# Patient Record
Sex: Female | Born: 1950 | Race: White | Hispanic: No | Smoking: Former smoker
Health system: Southern US, Community
[De-identification: ages and names within clinical notes are randomized; demographics above are authoritative.]

## PROBLEM LIST (undated history)

## (undated) DIAGNOSIS — E7800 Pure hypercholesterolemia, unspecified: Secondary | ICD-10-CM

## (undated) DIAGNOSIS — C801 Malignant (primary) neoplasm, unspecified: Secondary | ICD-10-CM

## (undated) DIAGNOSIS — I10 Essential (primary) hypertension: Secondary | ICD-10-CM

## (undated) HISTORY — PX: LUMBAR DISC SURGERY: SHX700

---

## 2010-09-05 ENCOUNTER — Ambulatory Visit: Payer: Self-pay | Admitting: Internal Medicine

## 2010-10-20 ENCOUNTER — Ambulatory Visit: Payer: Self-pay | Admitting: Orthopedic Surgery

## 2010-12-09 ENCOUNTER — Ambulatory Visit: Payer: Self-pay | Admitting: Pain Medicine

## 2011-01-14 ENCOUNTER — Ambulatory Visit: Payer: Self-pay | Admitting: Pain Medicine

## 2011-03-19 ENCOUNTER — Ambulatory Visit: Payer: Self-pay | Admitting: Sports Medicine

## 2011-04-07 ENCOUNTER — Ambulatory Visit: Payer: Self-pay | Admitting: Pain Medicine

## 2011-10-21 ENCOUNTER — Ambulatory Visit: Payer: Self-pay | Admitting: Internal Medicine

## 2012-07-28 ENCOUNTER — Ambulatory Visit: Payer: Self-pay | Admitting: Medical

## 2012-08-03 HISTORY — PX: CARPAL TUNNEL RELEASE: SHX101

## 2012-12-30 ENCOUNTER — Ambulatory Visit: Payer: Self-pay | Admitting: Internal Medicine

## 2013-05-03 HISTORY — PX: COLONOSCOPY: SHX174

## 2013-05-23 ENCOUNTER — Ambulatory Visit: Payer: Self-pay | Admitting: Gastroenterology

## 2013-05-23 LAB — HM COLONOSCOPY

## 2013-05-24 LAB — PATHOLOGY REPORT

## 2013-07-13 ENCOUNTER — Ambulatory Visit: Payer: Self-pay | Admitting: Orthopedic Surgery

## 2013-07-17 LAB — PATHOLOGY REPORT

## 2013-08-03 HISTORY — PX: MELANOMA EXCISION: SHX5266

## 2014-01-17 LAB — LIPID PANEL
Cholesterol: 150 mg/dL (ref 0–200)
HDL: 33 mg/dL — AB (ref 35–70)
LDL Cholesterol: 68 mg/dL
Triglycerides: 245 mg/dL — AB (ref 40–160)

## 2014-01-17 LAB — CBC AND DIFFERENTIAL: Hemoglobin: 15.5 g/dL (ref 12.0–16.0)

## 2014-01-17 LAB — TSH: TSH: 1.07 u[IU]/mL (ref <=5.90)

## 2014-01-24 ENCOUNTER — Ambulatory Visit: Payer: Self-pay | Admitting: Internal Medicine

## 2014-02-23 LAB — HM MAMMOGRAPHY: HM Mammogram: NORMAL

## 2014-08-20 ENCOUNTER — Ambulatory Visit: Payer: Self-pay | Admitting: Physician Assistant

## 2014-10-05 ENCOUNTER — Ambulatory Visit: Payer: Self-pay | Admitting: Internal Medicine

## 2014-11-23 NOTE — Op Note (Signed)
PATIENT NAME:  Casey Lara, Casey Lara MR#:  [id number] DATE OF BIRTH:  November 13, 1950  DATE OF PROCEDURE:  07/13/2013  PREOPERATIVE DIAGNOSIS: Right volar ganglion cyst, left dorsal ganglion cyst and carpal tunnel syndrome.   POSTOPERATIVE DIAGNOSIS: Right volar ganglion cyst, left dorsal ganglion cyst and carpal tunnel syndrome.   PROCEDURE: Removal, right volar ganglion cyst, removal, left dorsal ganglion cyst and left carpal tunnel release.   ANESTHESIA: General.   SURGEON: Hessie Knows, M.D.   DESCRIPTION OF PROCEDURE: The patient was brought to the operating room and after adequate anesthesia was obtained, both arms were prepped and draped in the usual sterile fashion. After patient identification and timeout procedures were completed, attention was turned to the right arm. The tourniquet was raised to 250 mmHg. A curvilinear incision was made to expose the ganglion cyst. It was quite extensive. The cyst was then excised and the base exposed down to the radiocarpal joint, which was debrided and cauterized to prevent recurrence. The tourniquet was let down, and there was a small branch of the radial artery which had excessive bleeding. This was cauterized. There is still a strong radial artery pulse at the close of the case and after good hemostasis had been achieved, the wound was thoroughly irrigated and with a tourniquet down, the wound was closed with simple interrupted 4-0 nylon skin sutures. The wound was dressed with Xeroform and left alone as the right arm was addressed. Tourniquet time on the right was 9 minutes. Going to the left arm, thee tourniquet was raised. The carpal tunnel was released first with a volar approach at the ring metacarpal. The skin and subcutaneous tissue were incised, after raising the tourniquet. The transcarpal ligament identified and incised with a hemostat underneath to protect the underlying structures. Release was carried out distally until fat was noted around the nerve.  Going proximally, there appeared to be constriction in the proximal half of the canal. After release,  the nerve appeared to have good vascular blush. There was extensive flexor tenosynovitis present. No masses. The wound was irrigated and then closed with first local infiltration of 10 mL 0.5% Sensorcaine without epinephrine. Wound was closed with 4-0 Vicryl. Going to the dorsum of the wrist, a transverse incision was made directly over the ganglion. It was approximately 2 cm in diameter. After opening the skin and removing the surrounding tissue, the ganglion popped up. It was amputated at its base and then tracked down to the dorsal radiocarpal joint, which was then also cauterized and abraded to try to cause scar tissue and prevent recurrence. The dorsal wound was then closed with 5-0 nylon skin suture. Xeroform, 4 x 4's, Webril and a volar splint were then applied. The tourniquet was then let down and going back to the right side, 4 x 4's, Webril and a volar splint were applied along with an Ace wrap. Tourniquet time on the left was 15 minutes at 250 mmHg on the right 9 minutes at 250 mmHg.   SPECIMEN: Ganglion cyst from each wrist.   COMPLICATIONS:  None.   EBL:  Minimal.    ____________________________ Laurene Footman, MD mjm:cc D: 07/13/2013 23:14:54 ET T: 07/14/2013 00:30:36 ET JOB#: [id number]  cc: Laurene Footman, MD, <Dictator> Laurene Footman MD ELECTRONICALLY SIGNED 07/14/2013 8:26

## 2015-02-11 ENCOUNTER — Other Ambulatory Visit: Payer: Self-pay | Admitting: Internal Medicine

## 2015-02-11 ENCOUNTER — Encounter: Payer: Self-pay | Admitting: Internal Medicine

## 2015-02-11 DIAGNOSIS — F419 Anxiety disorder, unspecified: Secondary | ICD-10-CM | POA: Insufficient documentation

## 2015-02-11 DIAGNOSIS — G4733 Obstructive sleep apnea (adult) (pediatric): Secondary | ICD-10-CM | POA: Insufficient documentation

## 2015-02-11 DIAGNOSIS — T7840XA Allergy, unspecified, initial encounter: Secondary | ICD-10-CM | POA: Insufficient documentation

## 2015-02-11 DIAGNOSIS — Z86010 Personal history of colonic polyps: Secondary | ICD-10-CM | POA: Insufficient documentation

## 2015-02-11 DIAGNOSIS — E785 Hyperlipidemia, unspecified: Secondary | ICD-10-CM | POA: Insufficient documentation

## 2015-02-11 DIAGNOSIS — Z85820 Personal history of malignant melanoma of skin: Secondary | ICD-10-CM | POA: Insufficient documentation

## 2015-02-11 DIAGNOSIS — I10 Essential (primary) hypertension: Secondary | ICD-10-CM | POA: Insufficient documentation

## 2015-02-11 DIAGNOSIS — F411 Generalized anxiety disorder: Secondary | ICD-10-CM | POA: Insufficient documentation

## 2015-02-11 DIAGNOSIS — G4700 Insomnia, unspecified: Secondary | ICD-10-CM | POA: Insufficient documentation

## 2015-02-11 DIAGNOSIS — M189 Osteoarthritis of first carpometacarpal joint, unspecified: Secondary | ICD-10-CM | POA: Insufficient documentation

## 2015-03-27 ENCOUNTER — Ambulatory Visit
Admission: EM | Admit: 2015-03-27 | Discharge: 2015-03-27 | Disposition: A | Discharge status: Home / Self Care | Payer: Managed Care, Other (non HMO) | Attending: Family Medicine | Admitting: Family Medicine

## 2015-03-27 ENCOUNTER — Encounter: Payer: Self-pay | Admitting: Internal Medicine

## 2015-03-27 DIAGNOSIS — H109 Unspecified conjunctivitis: Secondary | ICD-10-CM | POA: Diagnosis not present

## 2015-03-27 DIAGNOSIS — J0100 Acute maxillary sinusitis, unspecified: Secondary | ICD-10-CM

## 2015-03-27 HISTORY — DX: Essential (primary) hypertension: I10

## 2015-03-27 HISTORY — DX: Pure hypercholesterolemia, unspecified: E78.00

## 2015-03-27 MED ORDER — AMOXICILLIN 875 MG PO TABS: 875.0000 mg | ORAL_TABLET | Freq: Two times a day (BID) | ORAL | Status: DC

## 2015-03-27 NOTE — ED Notes (Signed)
Started last Friday with bilateral eye crusting in the morning. "Teared all day". Had worn "old mascara" on Thursday night. Eyes "feel funny". States feels like there is a sore in nose. Today right ear pain.

## 2015-03-27 NOTE — ED Provider Notes (Signed)
CSN: [id number]     Arrival date & time 03/27/15  1338 History   First MD Initiated Contact with Patient 03/27/15 1420     Chief Complaint  Patient presents with  . Eye Problem   (Consider location/radiation/quality/duration/timing/severity/associated sxs/prior Treatment) HPI Comments: 64 yo female with 1 week h/o sinus pressure, nasal congestion/drainage, headaches, ear fullness. Also states eyes have been watering and occasionally crusty. Denies fevers, chills.  Patient is a 64 y.o. female presenting with eye problem.  Eye Problem   Past Medical History  Diagnosis Date  . Hypertension   . Hypercholesteremia    Past Surgical History  Procedure Laterality Date  . Melanoma excision  2015  . Carpal tunnel release Left 2014  . Lumbar disc surgery    . Colonoscopy  05/2013    serrated adenoma   Family History  Problem Relation Age of Onset  . Breast cancer Mother   . Stroke Mother   . Cancer Father    Social History  Substance Use Topics  . Smoking status: Current Every Day Smoker -- 1.00 packs/day    Types: Cigarettes  . Smokeless tobacco: None  . Alcohol Use: No   OB History    No data available     Review of Systems  Allergies  Review of patient's allergies indicates no known allergies.  Home Medications   Prior to Admission medications   Medication Sig Start Date End Date Taking? Authorizing Provider  albuterol (VENTOLIN HFA) 108 (90 BASE) MCG/ACT inhaler Inhale 2 puffs into the lungs 4 (four) times daily as needed. 10/05/14  Yes Historical Provider, MD  amLODipine (NORVASC) 5 MG tablet Take 1 tablet by mouth daily. 09/18/14  Yes Historical Provider, MD  atorvastatin (LIPITOR) 20 MG tablet Take 1 tablet by mouth daily. 11/12/14  Yes Historical Provider, MD  bisoprolol-hydrochlorothiazide (ZIAC) 10-6.25 MG per tablet Take 1 tablet by mouth daily. 09/18/14  Yes Historical Provider, MD  diazepam (VALIUM) 5 MG tablet Take 1 tablet by mouth 2 (two) times daily. 12/11/14   Yes Historical Provider, MD  fluticasone (FLONASE) 50 MCG/ACT nasal spray Place 2 sprays into the nose daily. 04/06/14  Yes Historical Provider, MD  losartan (COZAAR) 50 MG tablet Take 1 tablet by mouth daily. 10/30/14  Yes Historical Provider, MD  amoxicillin (AMOXIL) 875 MG tablet Take 1 tablet (875 mg total) by mouth 2 (two) times daily. 03/27/15   Norval Gable, MD   BP 132/69 mmHg  Pulse 66  Temp(Src) 98.5 F (36.9 C) (Oral)  Resp 17  Ht 5\' 2"  (1.575 m)  Wt 148 lb (67.132 kg)  BMI 27.06 kg/m2  SpO2 99% Physical Exam  Constitutional: She appears well-developed and well-nourished. No distress.  HENT:  Head: Normocephalic and atraumatic.  Right Ear: Tympanic membrane, external ear and ear canal normal.  Left Ear: Tympanic membrane, external ear and ear canal normal.  Nose: Mucosal edema and rhinorrhea present. No nose lacerations, sinus tenderness, nasal deformity, septal deviation or nasal septal hematoma. No epistaxis.  No foreign bodies. Right sinus exhibits maxillary sinus tenderness and frontal sinus tenderness. Left sinus exhibits maxillary sinus tenderness and frontal sinus tenderness.  Mouth/Throat: Uvula is midline, oropharynx is clear and moist and mucous membranes are normal. No oropharyngeal exudate.  Eyes: EOM are normal. Pupils are equal, round, and reactive to light. Right eye exhibits no discharge. Left eye exhibits no discharge. Right conjunctiva is injected. Left conjunctiva is injected. No scleral icterus.  No drainage  Neck: Normal range of motion. Neck  supple. No thyromegaly present.  Cardiovascular: Normal rate, regular rhythm and normal heart sounds.   Pulmonary/Chest: Effort normal and breath sounds normal. No respiratory distress. She has no wheezes. She has no rales.  Lymphadenopathy:    She has no cervical adenopathy.  Skin: She is not diaphoretic.  Nursing note and vitals reviewed.   ED Course  Procedures (including critical care time) Labs Review Labs  Reviewed - No data to display  Imaging Review No results found.   MDM   1. Acute maxillary sinusitis, recurrence not specified   2. Bilateral conjunctivitis   (likely viral conjunctivitis)  New Prescriptions   AMOXICILLIN (AMOXIL) 875 MG TABLET    Take 1 tablet (875 mg total) by mouth 2 (two) times daily.  Plan: 1. diagnosis reviewed with patient 2. rx as per orders; risks, benefits, potential side effects reviewed with patient 3. Recommend supportive treatment with otc lubricant eye drops and cool compresses prn  4. F/u prn if symptoms worsen or don't improve     Norval Gable, MD 03/27/15 1504

## 2015-04-12 ENCOUNTER — Ambulatory Visit: Payer: Self-pay | Admitting: Internal Medicine

## 2015-04-12 ENCOUNTER — Encounter: Payer: Self-pay | Admitting: Internal Medicine

## 2015-04-12 DIAGNOSIS — Z8619 Personal history of other infectious and parasitic diseases: Secondary | ICD-10-CM | POA: Insufficient documentation

## 2015-05-29 ENCOUNTER — Other Ambulatory Visit: Payer: Self-pay | Admitting: Internal Medicine

## 2015-05-29 DIAGNOSIS — Z1231 Encounter for screening mammogram for malignant neoplasm of breast: Secondary | ICD-10-CM

## 2015-06-06 ENCOUNTER — Ambulatory Visit
Admission: RE | Admit: 2015-06-06 | Discharge: 2015-06-06 | Disposition: A | Discharge status: Home / Self Care | Payer: Managed Care, Other (non HMO) | Source: Ambulatory Visit | Attending: Internal Medicine | Admitting: Internal Medicine

## 2015-06-06 DIAGNOSIS — Z1231 Encounter for screening mammogram for malignant neoplasm of breast: Secondary | ICD-10-CM | POA: Diagnosis present

## 2015-06-06 HISTORY — DX: Malignant (primary) neoplasm, unspecified: C80.1

## 2015-06-19 ENCOUNTER — Ambulatory Visit (INDEPENDENT_AMBULATORY_CARE_PROVIDER_SITE_OTHER): Payer: Managed Care, Other (non HMO) | Admitting: Internal Medicine

## 2015-06-19 ENCOUNTER — Encounter: Payer: Self-pay | Admitting: Internal Medicine

## 2015-06-19 VITALS — BP 110/64 | HR 72 | Ht 63.0 in | Wt 141.6 lb

## 2015-06-19 DIAGNOSIS — K5900 Constipation, unspecified: Secondary | ICD-10-CM | POA: Diagnosis not present

## 2015-06-19 DIAGNOSIS — M5442 Lumbago with sciatica, left side: Secondary | ICD-10-CM

## 2015-06-19 DIAGNOSIS — Z1159 Encounter for screening for other viral diseases: Secondary | ICD-10-CM | POA: Diagnosis not present

## 2015-06-19 DIAGNOSIS — Z0000 Encounter for general adult medical examination without abnormal findings: Secondary | ICD-10-CM | POA: Diagnosis not present

## 2015-06-19 DIAGNOSIS — I10 Essential (primary) hypertension: Secondary | ICD-10-CM | POA: Diagnosis not present

## 2015-06-19 DIAGNOSIS — M5136 Other intervertebral disc degeneration, lumbar region: Secondary | ICD-10-CM | POA: Insufficient documentation

## 2015-06-19 DIAGNOSIS — Z23 Encounter for immunization: Secondary | ICD-10-CM

## 2015-06-19 DIAGNOSIS — Z114 Encounter for screening for human immunodeficiency virus [HIV]: Secondary | ICD-10-CM | POA: Diagnosis not present

## 2015-06-19 DIAGNOSIS — D126 Benign neoplasm of colon, unspecified: Secondary | ICD-10-CM | POA: Diagnosis not present

## 2015-06-19 DIAGNOSIS — E785 Hyperlipidemia, unspecified: Secondary | ICD-10-CM

## 2015-06-19 LAB — POCT URINALYSIS DIPSTICK
Bilirubin, UA: NEGATIVE
Blood, UA: NEGATIVE
GLUCOSE UA: NEGATIVE
Ketones, UA: NEGATIVE
Leukocytes, UA: NEGATIVE
NITRITE UA: NEGATIVE
PROTEIN UA: NEGATIVE
Spec Grav, UA: 1.01
UROBILINOGEN UA: 0.2
pH, UA: 5

## 2015-06-19 MED ORDER — LUBIPROSTONE 24 MCG PO CAPS: 24.0000 ug | ORAL_CAPSULE | Freq: Two times a day (BID) | ORAL | Status: DC

## 2015-06-19 NOTE — Patient Instructions (Signed)
Breast Self-Awareness Practicing breast self-awareness may pick up problems early, prevent significant medical complications, and possibly save your life. By practicing breast self-awareness, you can become familiar with how your breasts look and feel and if your breasts are changing. This allows you to notice changes early. It can also offer you some reassurance that your breast health is good. One way to learn what is normal for your breasts and whether your breasts are changing is to do a breast self-exam. If you find a lump or something that was not present in the past, it is best to contact your caregiver right away. Other findings that should be evaluated by your caregiver include nipple discharge, especially if it is bloody; skin changes or reddening; areas where the skin seems to be pulled in (retracted); or new lumps and bumps. Breast pain is seldom associated with cancer (malignancy), but should also be evaluated by a caregiver. HOW TO PERFORM A BREAST SELF-EXAM The best time to examine your breasts is 5-7 days after your menstrual period is over. During menstruation, the breasts are lumpier, and it may be more difficult to pick up changes. If you do not menstruate, have reached menopause, or had your uterus removed (hysterectomy), you should examine your breasts at regular intervals, such as monthly. If you are breastfeeding, examine your breasts after a feeding or after using a breast pump. Breast implants do not decrease the risk for lumps or tumors, so continue to perform breast self-exams as recommended. Talk to your caregiver about how to determine the difference between the implant and breast tissue. Also, talk about the amount of pressure you should use during the exam. Over time, you will become more familiar with the variations of your breasts and more comfortable with the exam. A breast self-exam requires you to remove all your clothes above the waist. 1. Look at your breasts and nipples.  Stand in front of a mirror in a room with good lighting. With your hands on your hips, push your hands firmly downward. Look for a difference in shape, contour, and size from one breast to the other (asymmetry). Asymmetry includes puckers, dips, or bumps. Also, look for skin changes, such as reddened or scaly areas on the breasts. Look for nipple changes, such as discharge, dimpling, repositioning, or redness. 2. Carefully feel your breasts. This is best done either in the shower or tub while using soapy water or when flat on your back. Place the arm (on the side of the breast you are examining) above your head. Use the pads (not the fingertips) of your three middle fingers on your opposite hand to feel your breasts. Start in the underarm area and use  inch (2 cm) overlapping circles to feel your breast. Use 3 different levels of pressure (light, medium, and firm pressure) at each circle before moving to the next circle. The light pressure is needed to feel the tissue closest to the skin. The medium pressure will help to feel breast tissue a little deeper, while the firm pressure is needed to feel the tissue close to the ribs. Continue the overlapping circles, moving downward over the breast until you feel your ribs below your breast. Then, move one finger-width towards the center of the body. Continue to use the  inch (2 cm) overlapping circles to feel your breast as you move slowly up toward the collar bone (clavicle) near the base of the neck. Continue the up and down exam using all 3 pressures until you reach the   middle of the chest. Do this with each breast, carefully feeling for lumps or changes. 3.  Keep a written record with breast changes or normal findings for each breast. By writing this information down, you do not need to depend only on memory for size, tenderness, or location. Write down where you are in your menstrual cycle, if you are still menstruating. Breast tissue can have some lumps or  thick tissue. However, see your caregiver if you find anything that concerns you.  SEEK MEDICAL CARE IF:  You see a change in shape, contour, or size of your breasts or nipples.   You see skin changes, such as reddened or scaly areas on the breasts or nipples.   You have an unusual discharge from your nipples.   You feel a new lump or unusually thick areas.    This information is not intended to replace advice given to you by your health care provider. Make sure you discuss any questions you have with your health care provider.   Document Released: 07/20/2005 Document Revised: 07/06/2012 Document Reviewed: 11/04/2011 Elsevier Interactive Patient Education 2016 [employer]  

## 2015-06-19 NOTE — Progress Notes (Signed)
Date:  06/19/2015   Name:  Casey Lara   DOB:  Aug 20, 1950   MRN:  [id number]   Chief Complaint: Annual Exam; Hypertension; and Hyperlipidemia Casey Lara is a 64 y.o. female who presents today for her Complete Annual Exam. She feels well but tired. She reports exercising regularly with a Physiological scientist. She reports she is sleeping fairly well using valium.  Lumbar back pain - patient has degenerative disc disease in the lumbar spine. She has intermittent left thigh tingling. She denies any weakness or permanent neurologic symptoms. She continues to do her usual activities. She takes Tylenol for pain. She's been told that she should have back surgery and we discussed this. Hypertension This is a chronic problem. The current episode started more than 1 year ago. The problem is unchanged. The problem is controlled. Pertinent negatives include no chest pain, headaches, palpitations or shortness of breath. There are no associated agents to hypertension.  Hyperlipidemia This is a chronic problem. The problem is controlled. Recent lipid tests were reviewed and are normal. She has no history of diabetes or hypothyroidism. Pertinent negatives include no chest pain or shortness of breath. Current antihyperlipidemic treatment includes statins. The current treatment provides significant improvement of lipids.  Constipation This is a chronic problem. The current episode started more than 1 year ago. The problem is unchanged. Associated symptoms include back pain (chronic DDD). Pertinent negatives include no abdominal pain, diarrhea, difficulty urinating, fever, rectal pain, vomiting or weight loss. Risk factors include stress. She has tried laxatives and stool softeners for the symptoms. The treatment provided mild relief.   She has been taking MiraLAX daily and supplementing with milk of magnesia if needed. However she either has small hard stools or explosive diarrhea. Her colonoscopy 2 years  ago was essentially normal. She has been unable to get on a schedule that allows her to have regular soft consistency bowel movements.  Review of Systems  Constitutional: Positive for fatigue. Negative for fever, chills, weight loss and diaphoresis.  HENT: Positive for sinus pressure, tinnitus and trouble swallowing. Negative for hearing loss.   Eyes: Positive for pain (irritated) and visual disturbance.  Respiratory: Negative for chest tightness, shortness of breath and wheezing.   Cardiovascular: Negative for chest pain, palpitations and leg swelling.  Gastrointestinal: Positive for constipation. Negative for vomiting, abdominal pain, diarrhea and rectal pain.  Endocrine: Negative for polydipsia and polyuria.  Genitourinary: Negative for dysuria, frequency, hematuria, vaginal bleeding, vaginal discharge and difficulty urinating.  Musculoskeletal: Positive for back pain (chronic DDD). Negative for joint swelling and gait problem.  Skin: Negative for color change and rash.  Neurological: Positive for numbness (left lateral thigh intermittently). Negative for dizziness, weakness, light-headedness and headaches.  Hematological: Negative for adenopathy.  Psychiatric/Behavioral: Positive for sleep disturbance. Negative for dysphoric mood and decreased concentration.    Patient Active Problem List   Diagnosis Date Noted  . H/O varicella 04/12/2015  . Obstructive apnea 02/11/2015  . 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 Dyslipidemia 02/11/2015  . Allergic state 02/11/2015  . Essential (primary) hypertension 02/11/2015  . H/O adenomatous polyp of colon 02/11/2015  . Anxiety 02/11/2015  . Insomnia, persistent 02/11/2015  . CMC arthritis, thumb, degenerative 02/11/2015    Prior to Admission medications   Medication Sig Start Date End Date Taking? Authorizing Provider  albuterol (VENTOLIN HFA) 108 (90 BASE) MCG/ACT inhaler Inhale 2 puffs into the lungs 4 (four) times daily as needed.  10/05/14  Yes Historical Provider, MD  amLODipine (  NORVASC) 5 MG tablet Take 1 tablet by mouth daily. 09/18/14  Yes Historical Provider, MD  atorvastatin (LIPITOR) 20 MG tablet Take 1 tablet by mouth daily. 11/12/14  Yes Historical Provider, MD  bisoprolol-hydrochlorothiazide (ZIAC) 10-6.25 MG per tablet Take 1 tablet by mouth daily. 09/18/14  Yes Historical Provider, MD  diazepam (VALIUM) 5 MG tablet Take 1 tablet by mouth 2 (two) times daily. 12/11/14  Yes Historical Provider, MD  fluticasone (FLONASE) 50 MCG/ACT nasal spray Place 2 sprays into the nose daily. 04/06/14  Yes Historical Provider, MD  losartan (COZAAR) 50 MG tablet Take 1 tablet by mouth daily. 10/30/14  Yes Historical Provider, MD    No Known Allergies  Past Surgical History  Procedure Laterality Date  . Melanoma excision  2015  . Carpal tunnel release Left 2014  . Lumbar disc surgery    . Colonoscopy  05/2013    serrated adenoma    Social History  Substance Use Topics  . Smoking status: Current Every Day Smoker -- 1.00 packs/day    Types: Cigarettes  . Smokeless tobacco: None  . Alcohol Use: No     Medication list has been reviewed and updated.   Physical Exam  Constitutional: She is oriented to person, place, and time. She appears well-developed and well-nourished. No distress.  HENT:  Head: Normocephalic and atraumatic.  Right Ear: Tympanic membrane and ear canal normal.  Left Ear: Tympanic membrane and ear canal normal.  Nose: Right sinus exhibits no maxillary sinus tenderness. Left sinus exhibits no maxillary sinus tenderness.  Mouth/Throat: Uvula is midline and oropharynx is clear and moist.  Eyes: Conjunctivae and EOM are normal. Right eye exhibits no discharge. Left eye exhibits no discharge. No scleral icterus.  Neck: Normal range of motion. Neck supple. Carotid bruit is not present. No erythema present. No thyromegaly present.  Cardiovascular: Normal rate, regular rhythm, normal heart sounds, intact distal  pulses and normal pulses.   Pulmonary/Chest: Effort normal. No respiratory distress. She has no wheezes. Right breast exhibits no mass, no nipple discharge, no skin change and no tenderness. Left breast exhibits no mass, no nipple discharge, no skin change and no tenderness.  Abdominal: Soft. Bowel sounds are normal. There is no hepatosplenomegaly. There is no tenderness. There is no CVA tenderness.  Musculoskeletal: Normal range of motion. She exhibits no edema or tenderness.       Lumbar back: She exhibits spasm. She exhibits no bony tenderness, no swelling and no edema.  Lymphadenopathy:    She has no cervical adenopathy.    She has no axillary adenopathy.  Neurological: She is alert and oriented to person, place, and time. She has normal reflexes. No cranial nerve deficit or sensory deficit.  Skin: Skin is warm, dry and intact. No rash noted.  Psychiatric: She has a normal mood and affect. Her speech is normal and behavior is normal. Thought content normal.  Nursing note and vitals reviewed.   BP 110/64 mmHg  Pulse 72  Ht 5\' 3"  (1.6 m)  Wt 141 lb 9.6 oz (64.229 kg)  BMI 25.09 kg/m2  Assessment and Plan: 1. Annual physical exam Mammogram is up-to-date; patient sees GYN for Pap and pelvic exams Will obtain labs to rule out metabolic causes of fatigue - POCT urinalysis dipstick - Hepatitis C antibody - HIV antibody  2. Flu vaccine need - Flu Vaccine QUAD 36+ mos PF IM (Fluarix & Fluzone Quad PF)  3. Essential (primary) hypertension Controlled on current regimen - CBC with Differential/Platelet - Comprehensive  metabolic panel - TSH  4. Dyslipidemia On statin therapy - Lipid panel  5. Need for hepatitis C screening test - Hepatitis C antibody  6. Encounter for screening for HIV - HIV antibody  7. Adenomatous colon polyp Found in 2014; repeat in 2019  8. Constipation, unspecified constipation type Stop MiraLAX and milk of magnesia Begin Amatiza 24 g twice a day with  food (samples given)  9. Bilateral low back pain with left-sided sciatica Continue Tylenol as needed and activity as tolerated I recommend avoiding surgery until she has more significant neurologic symptoms on a regular basis   Halina Maidens, MD [facility]  06/19/2015

## 2015-06-20 LAB — LIPID PANEL
CHOLESTEROL TOTAL: 132 mg/dL (ref 100–199)
Chol/HDL Ratio: 4.1 ratio units (ref 0.0–4.4)
HDL: 32 mg/dL — ABNORMAL LOW (ref >39)
LDL CALC: 45 mg/dL (ref 0–99)
TRIGLYCERIDES: 274 mg/dL — AB (ref 0–149)
VLDL Cholesterol Cal: 55 mg/dL — ABNORMAL HIGH (ref 5–40)

## 2015-06-20 LAB — CBC WITH DIFFERENTIAL/PLATELET
Basophils Absolute: 0 10*3/uL (ref 0.0–0.2)
Basos: 0 %
EOS (ABSOLUTE): 0.1 10*3/uL (ref 0.0–0.4)
Eos: 1 %
HEMATOCRIT: 45.2 % (ref 34.0–46.6)
Hemoglobin: 15.7 g/dL (ref 11.1–15.9)
Immature Grans (Abs): 0 10*3/uL (ref 0.0–0.1)
Immature Granulocytes: 0 %
LYMPHS ABS: 2.3 10*3/uL (ref 0.7–3.1)
LYMPHS: 28 %
MCH: 31.1 pg (ref 26.6–33.0)
MCHC: 34.7 g/dL (ref 31.5–35.7)
MCV: 90 fL (ref 79–97)
Monocytes Absolute: 0.3 10*3/uL (ref 0.1–0.9)
Monocytes: 4 %
Neutrophils Absolute: 5.6 10*3/uL (ref 1.4–7.0)
Neutrophils: 67 %
Platelets: 199 10*3/uL (ref 150–379)
RBC: 5.05 x10E6/uL (ref 3.77–5.28)
RDW: 13.6 % (ref 12.3–15.4)
WBC: 8.3 10*3/uL (ref 3.4–10.8)

## 2015-06-20 LAB — HEPATITIS C ANTIBODY

## 2015-06-20 LAB — COMPREHENSIVE METABOLIC PANEL
A/G RATIO: 1.6 (ref 1.1–2.5)
ALT: 22 IU/L (ref 0–32)
AST: 24 IU/L (ref 0–40)
Albumin: 4.5 g/dL (ref 3.6–4.8)
Alkaline Phosphatase: 59 IU/L (ref 39–117)
BILIRUBIN TOTAL: 0.6 mg/dL (ref 0.0–1.2)
BUN/Creatinine Ratio: 23 (ref 11–26)
BUN: 18 mg/dL (ref 8–27)
CHLORIDE: 101 mmol/L (ref 97–106)
CO2: 27 mmol/L (ref 18–29)
Calcium: 9.6 mg/dL (ref 8.7–10.3)
Creatinine, Ser: 0.78 mg/dL (ref 0.57–1.00)
GFR calc non Af Amer: 81 mL/min/{1.73_m2} (ref >59)
GFR, EST AFRICAN AMERICAN: 93 mL/min/{1.73_m2} (ref >59)
GLOBULIN, TOTAL: 2.9 g/dL (ref 1.5–4.5)
Glucose: 98 mg/dL (ref 65–99)
POTASSIUM: 4.2 mmol/L (ref 3.5–5.2)
SODIUM: 144 mmol/L (ref 136–144)
Total Protein: 7.4 g/dL (ref 6.0–8.5)

## 2015-06-20 LAB — TSH: TSH: 1.33 u[IU]/mL (ref 0.450–4.500)

## 2015-06-20 LAB — HIV ANTIBODY (ROUTINE TESTING W REFLEX): HIV SCREEN 4TH GENERATION: NONREACTIVE

## 2015-07-09 ENCOUNTER — Other Ambulatory Visit: Payer: Self-pay | Admitting: Internal Medicine

## 2015-07-17 ENCOUNTER — Other Ambulatory Visit: Payer: Self-pay

## 2015-07-17 MED ORDER — DIAZEPAM 5 MG PO TABS: 5.0000 mg | ORAL_TABLET | Freq: Two times a day (BID) | ORAL | Status: DC | PRN

## 2015-08-01 ENCOUNTER — Other Ambulatory Visit: Payer: Self-pay

## 2015-08-01 MED ORDER — DIAZEPAM 5 MG PO TABS: 5.0000 mg | ORAL_TABLET | Freq: Two times a day (BID) | ORAL | Status: DC | PRN

## 2015-08-16 ENCOUNTER — Other Ambulatory Visit: Payer: Self-pay | Admitting: Internal Medicine

## 2015-11-03 ENCOUNTER — Other Ambulatory Visit: Payer: Self-pay | Admitting: Internal Medicine

## 2016-01-06 ENCOUNTER — Other Ambulatory Visit: Payer: Self-pay | Admitting: Internal Medicine

## 2016-01-28 ENCOUNTER — Other Ambulatory Visit: Payer: Self-pay | Admitting: Internal Medicine

## 2016-03-04 DIAGNOSIS — D485 Neoplasm of uncertain behavior of skin: Secondary | ICD-10-CM | POA: Diagnosis not present

## 2016-03-04 DIAGNOSIS — Z09 Encounter for follow-up examination after completed treatment for conditions other than malignant neoplasm: Secondary | ICD-10-CM | POA: Diagnosis not present

## 2016-03-04 DIAGNOSIS — Z872 Personal history of diseases of the skin and subcutaneous tissue: Secondary | ICD-10-CM | POA: Diagnosis not present

## 2016-03-04 DIAGNOSIS — Z85820 Personal history of malignant melanoma of skin: Secondary | ICD-10-CM | POA: Diagnosis not present

## 2016-03-04 DIAGNOSIS — L920 Granuloma annulare: Secondary | ICD-10-CM | POA: Diagnosis not present

## 2016-03-04 DIAGNOSIS — Z1283 Encounter for screening for malignant neoplasm of skin: Secondary | ICD-10-CM | POA: Diagnosis not present

## 2016-03-04 DIAGNOSIS — Z08 Encounter for follow-up examination after completed treatment for malignant neoplasm: Secondary | ICD-10-CM | POA: Diagnosis not present

## 2016-03-04 DIAGNOSIS — L570 Actinic keratosis: Secondary | ICD-10-CM | POA: Diagnosis not present

## 2016-03-26 ENCOUNTER — Other Ambulatory Visit: Payer: Self-pay | Admitting: Internal Medicine

## 2016-04-29 DIAGNOSIS — L570 Actinic keratosis: Secondary | ICD-10-CM | POA: Diagnosis not present

## 2016-06-19 ENCOUNTER — Ambulatory Visit
Admission: RE | Admit: 2016-06-19 | Discharge: 2016-06-19 | Disposition: A | Discharge status: Home / Self Care | Payer: Medicare Other | Source: Ambulatory Visit | Attending: Internal Medicine | Admitting: Internal Medicine

## 2016-06-19 ENCOUNTER — Encounter: Payer: Self-pay | Admitting: Internal Medicine

## 2016-06-19 ENCOUNTER — Ambulatory Visit (INDEPENDENT_AMBULATORY_CARE_PROVIDER_SITE_OTHER): Payer: Medicare Other | Admitting: Internal Medicine

## 2016-06-19 VITALS — BP 128/82 | HR 60 | Resp 16 | Ht 63.0 in | Wt 128.0 lb

## 2016-06-19 DIAGNOSIS — Z0000 Encounter for general adult medical examination without abnormal findings: Secondary | ICD-10-CM

## 2016-06-19 DIAGNOSIS — F17200 Nicotine dependence, unspecified, uncomplicated: Secondary | ICD-10-CM | POA: Insufficient documentation

## 2016-06-19 DIAGNOSIS — R634 Abnormal weight loss: Secondary | ICD-10-CM | POA: Diagnosis not present

## 2016-06-19 DIAGNOSIS — G4700 Insomnia, unspecified: Secondary | ICD-10-CM | POA: Diagnosis not present

## 2016-06-19 DIAGNOSIS — I700 Atherosclerosis of aorta: Secondary | ICD-10-CM | POA: Insufficient documentation

## 2016-06-19 DIAGNOSIS — Z1231 Encounter for screening mammogram for malignant neoplasm of breast: Secondary | ICD-10-CM

## 2016-06-19 DIAGNOSIS — E785 Hyperlipidemia, unspecified: Secondary | ICD-10-CM | POA: Diagnosis not present

## 2016-06-19 DIAGNOSIS — I10 Essential (primary) hypertension: Secondary | ICD-10-CM | POA: Diagnosis not present

## 2016-06-19 DIAGNOSIS — R05 Cough: Secondary | ICD-10-CM | POA: Diagnosis not present

## 2016-06-19 DIAGNOSIS — C4359 Malignant melanoma of other part of trunk: Secondary | ICD-10-CM

## 2016-06-19 DIAGNOSIS — Z23 Encounter for immunization: Secondary | ICD-10-CM

## 2016-06-19 DIAGNOSIS — F17201 Nicotine dependence, unspecified, in remission: Secondary | ICD-10-CM | POA: Insufficient documentation

## 2016-06-19 LAB — POCT URINALYSIS DIPSTICK
Bilirubin, UA: NEGATIVE
Blood, UA: NEGATIVE
Glucose, UA: NEGATIVE
Ketones, UA: NEGATIVE
LEUKOCYTES UA: NEGATIVE
NITRITE UA: NEGATIVE
PH UA: 6.5
PROTEIN UA: NEGATIVE
Spec Grav, UA: 1.02
UROBILINOGEN UA: 0.2

## 2016-06-19 MED ORDER — DIAZEPAM 5 MG PO TABS: 5.0000 mg | ORAL_TABLET | Freq: Two times a day (BID) | ORAL | 1 refills | Status: DC

## 2016-06-19 NOTE — Progress Notes (Signed)
Date:  06/19/2016   Name:  Envy Lust   DOB:  February 01, 1951   MRN:  [id number]   Chief Complaint: Annual Exam Annalia Lezon Nanni is a 65 y.o. female who presents today for her Complete Annual Exam. She feels well. She reports exercising regularly. She reports she is sleeping well. Her last pap with Jola Babinski was three years ago and was normal.  She is due for mammogram.    Hypertension  This is a chronic problem. The problem is unchanged. The problem is controlled. Pertinent negatives include no chest pain, headaches, palpitations or shortness of breath.  Hyperlipidemia  This is a chronic problem. The problem is controlled. Pertinent negatives include no chest pain or shortness of breath. Current antihyperlipidemic treatment includes statins.  Constipation  Pertinent negatives include no abdominal pain, diarrhea, fever or vomiting. Treatments tried: Amitiza.  Weight Loss - she continues to lose weight - 13 lbs over the past year.  She has somewhat decreased appetite since her husband died - she feels like it is hitting her harder now than before.  In the past when she had significant family stress, she has lost weight unintentionally.  She does however, continue to smoke.   Review of Systems  Constitutional: Positive for appetite change and unexpected weight change (20 lbs in the past 18 mo.). Negative for chills, fatigue and fever.  HENT: Negative for congestion, hearing loss, tinnitus, trouble swallowing and voice change.   Eyes: Negative for visual disturbance.  Respiratory: Positive for cough. Negative for chest tightness, shortness of breath and wheezing.   Cardiovascular: Negative for chest pain, palpitations and leg swelling.  Gastrointestinal: Positive for constipation. Negative for abdominal pain, blood in stool, diarrhea and vomiting.  Endocrine: Positive for cold intolerance. Negative for polydipsia and polyuria.  Genitourinary: Negative for dysuria, frequency, genital  sores, vaginal bleeding and vaginal discharge.  Musculoskeletal: Negative for arthralgias, gait problem and joint swelling.  Skin: Negative for color change and rash.  Neurological: Negative for dizziness, tremors, light-headedness and headaches.  Hematological: Negative for adenopathy. Does not bruise/bleed easily.  Psychiatric/Behavioral: Negative for confusion, dysphoric mood and sleep disturbance. The patient is not nervous/anxious.     Patient Active Problem List   Diagnosis Date Noted  . Adenomatous colon polyp 06/19/2015  . Constipation 06/19/2015  . Bilateral low back pain with left-sided sciatica 06/19/2015  . H/O varicella 04/12/2015  . Obstructive apnea 02/11/2015  . 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 Dyslipidemia 02/11/2015  . Allergic state 02/11/2015  . Essential (primary) hypertension 02/11/2015  . Anxiety 02/11/2015  . Insomnia, persistent 02/11/2015  . CMC arthritis, thumb, degenerative 02/11/2015    Prior to Admission medications   Medication Sig Start Date End Date Taking? Authorizing Provider  amLODipine (NORVASC) 5 MG tablet TAKE 1 TABLET BY MOUTH EVERY DAY 08/16/15   Glean Hess, MD  atorvastatin (LIPITOR) 20 MG tablet TAKE 1 TABLET BY MOUTH AT BEDTIME 11/03/15   Glean Hess, MD  bisoprolol-hydrochlorothiazide [facility]) 10-6.25 MG tablet TAKE 1 TABLET EVERY DAY 08/16/15   Glean Hess, MD  diazepam (VALIUM) 5 MG tablet TAKE 1 TABLET BY MOUTH 2 TIMES DAILY 03/30/16   Glean Hess, MD  fluticasone [facility]) 50 MCG/ACT nasal spray USE 2 SPRAYS IN [facility] NOSTRIL DAILY 01/06/16   Glean Hess, MD  losartan (COZAAR) 50 MG tablet TAKE 1 TABLET EVERY DAY 11/03/15   Glean Hess, MD  lubiprostone (AMITIZA) 24 MCG capsule Take 1 capsule (24  mcg total) by mouth 2 (two) times daily with a meal. 06/19/15   Glean Hess, MD  VENTOLIN HFA 108 [phone] Base) MCG/ACT inhaler TAKE 2 PUFFS 4 TIMES A DAY 01/06/16   Glean Hess, MD    No Known  Allergies  Past Surgical History:  Procedure Laterality Date  . CARPAL TUNNEL RELEASE Left 2014  . COLONOSCOPY  05/2013   serrated adenoma  . LUMBAR DISC SURGERY    . MELANOMA EXCISION  2015    Social History  Substance Use Topics  . Smoking status: Current Every Day Smoker    Packs/day: 1.00    Types: Cigarettes  . Smokeless tobacco: Never Used  . Alcohol use No   Depression screen PHQ 2/9 06/19/2016  Decreased Interest 0  Down, Depressed, Hopeless 0  PHQ - 2 Score 0     Medication list has been reviewed and updated.   Physical Exam  Constitutional: She is oriented to person, place, and time. She appears well-developed and well-nourished. No distress.  HENT:  Head: Normocephalic and atraumatic.  Right Ear: Tympanic membrane and ear canal normal.  Left Ear: Tympanic membrane and ear canal normal.  Nose: Right sinus exhibits no maxillary sinus tenderness. Left sinus exhibits no maxillary sinus tenderness.  Mouth/Throat: Uvula is midline and oropharynx is clear and moist.  Eyes: Conjunctivae and EOM are normal. Right eye exhibits no discharge. Left eye exhibits no discharge. No scleral icterus.  Neck: Normal range of motion. Carotid bruit is not present. No erythema present. No thyromegaly present.  Cardiovascular: Normal rate, regular rhythm, normal heart sounds and normal pulses.   Pulmonary/Chest: Effort normal. No respiratory distress. She has no wheezes. Right breast exhibits no mass, no nipple discharge, no skin change and no tenderness. Left breast exhibits no mass, no nipple discharge, no skin change and no tenderness.  Abdominal: Soft. Bowel sounds are normal. There is no hepatosplenomegaly. There is no tenderness. There is no CVA tenderness.  Musculoskeletal: Normal range of motion.  Lymphadenopathy:    She has no cervical adenopathy.    She has no axillary adenopathy.  Neurological: She is alert and oriented to person, place, and time. She has normal reflexes.  No cranial nerve deficit or sensory deficit.  Skin: Skin is warm, dry and intact. No rash noted.  Psychiatric: She has a normal mood and affect. Her speech is normal and behavior is normal. Thought content normal.  Nursing note and vitals reviewed.   BP 128/82   Pulse 60   Resp 16   Ht 5\' 3"  (1.6 m)   Wt 128 lb (58.1 kg)   SpO2 98%   BMI 22.67 kg/m   Assessment and Plan: 1. Annual physical exam Patient will return for AMW   2. Essential (primary) hypertension controlled - CBC with Differential/Platelet - POCT urinalysis dipstick - TSH  3. Insomnia, persistent - diazepam (VALIUM) 5 MG tablet; Take 1 tablet (5 mg total) by mouth 2 (two) times daily.  Dispense: 60 tablet; Refill: 1  4. Dyslipidemia On statin therapy - Comprehensive metabolic panel - Lipid panel  5. 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 Need for pneumococcal vaccination - Pneumococcal conjugate vaccine 13-valent IM  7. Encounter for screening mammogram for breast cancer - MM DIGITAL SCREENING BILATERAL; Future  8. Encounter for immunization - Flu Vaccine QUAD 36+ mos IM  9. Tobacco dependence Concern with weight loss - will get CXR Not interested in quitting at this time - DG Chest  2 View; Future   Halina Maidens, MD [facility]  06/19/2016

## 2016-06-19 NOTE — Patient Instructions (Signed)
Health Maintenance  Topic Date Due  . MAMMOGRAM  06/05/2017  . PNA vac Low Risk Adult (2 of 2 - PPSV23) 05/29/2018  . COLONOSCOPY  05/24/2023  . TETANUS/TDAP  01/18/2024  . INFLUENZA VACCINE  Completed  . DEXA SCAN  Completed  . Hepatitis C Screening  Completed  . HIV Screening  Completed  . PAP SMEAR  Excluded  . ZOSTAVAX  Excluded     Pneumococcal Conjugate Vaccine (PCV13) What You Need to Know 1. Why get vaccinated? Vaccination can protect both children and adults from pneumococcal disease. Pneumococcal disease is caused by bacteria that can spread from person to person through close contact. It can cause ear infections, and it can also lead to more serious infections of the:  Lungs (pneumonia),  Blood (bacteremia), and  Covering of the brain and spinal cord (meningitis). Pneumococcal pneumonia is most common among adults. Pneumococcal meningitis can cause deafness and brain damage, and it kills about 1 child in 10 who get it. Anyone can get pneumococcal disease, but children under 24 years of age and adults 28 years and older, people with certain medical conditions, and cigarette smokers are at the highest risk. Before there was a vaccine, the [place] saw:  more than 700 cases of meningitis,  about 13,000 blood infections,  about 5 million ear infections, and  about 200 deaths in children under 5 each year from pneumococcal disease. Since vaccine became available, severe pneumococcal disease in these children has fallen by 88%. About 18,000 older adults die of pneumococcal disease each year in the [place]. Treatment of pneumococcal infections with penicillin and other drugs is not as effective as it used to be, because some strains of the disease have become resistant to these drugs. This makes prevention of the disease, through vaccination, even more important. 2. PCV13 vaccine Pneumococcal conjugate vaccine (called PCV13) protects against 13 types of  pneumococcal bacteria. PCV13 is routinely given to children at 2, 4, 6, and 68-19 months of age. It is also recommended for children and adults 8 to 31 years of age with certain health conditions, and for all adults 36 years of age and older. Your doctor can give you details. 3. Some people should not get this vaccine Anyone who has ever had a life-threatening allergic reaction to a dose of this vaccine, to an earlier pneumococcal vaccine called PCV7, or to any vaccine containing diphtheria toxoid (for example, DTaP), should not get PCV13. Anyone with a severe allergy to any component of PCV13 should not get the vaccine. Tell your doctor if the person being vaccinated has any severe allergies. If the person scheduled for vaccination is not feeling well, your healthcare provider might decide to reschedule the shot on another day. 4. Risks of a vaccine reaction With any medicine, including vaccines, there is a chance of reactions. These are usually mild and go away on their own, but serious reactions are also possible. Problems reported following PCV13 varied by age and dose in the series. The most common problems reported among children were:  About half became drowsy after the shot, had a temporary loss of appetite, or had redness or tenderness where the shot was given.  About 1 out of 3 had swelling where the shot was given.  About 1 out of 3 had a mild fever, and about 1 in 20 had a fever over 102.20F.  Up to about 8 out of 10 became fussy or irritable. Adults have reported pain, redness, and swelling where  the shot was given; also mild fever, fatigue, headache, chills, or muscle pain. Young children who get PCV13 along with inactivated flu vaccine at the same time may be at increased risk for seizures caused by fever. Ask your doctor for more information. Problems that could happen after any vaccine:  People sometimes faint after a medical procedure, including vaccination. Sitting or lying  down for about 15 minutes can help prevent fainting, and injuries caused by a fall. Tell your doctor if you feel dizzy, or have vision changes or ringing in the ears.  Some older children and adults get severe pain in the shoulder and have difficulty moving the arm where a shot was given. This happens very rarely.  Any medication can cause a severe allergic reaction. Such reactions from a vaccine are very rare, estimated at about 1 in a million doses, and would happen within a few minutes to a few hours after the vaccination. As with any medicine, there is a very small chance of a vaccine causing a serious injury or death. The safety of vaccines is always being monitored. For more information, visit: http://www.aguilar.org/ 5. What if there is a serious reaction? What should I look for? Look for anything that concerns you, such as signs of a severe allergic reaction, very high fever, or unusual behavior. Signs of a severe allergic reaction can include hives, swelling of the face and throat, difficulty breathing, a fast heartbeat, dizziness, and weakness-usually within a few minutes to a few hours after the vaccination. What should I do?  If you think it is a severe allergic reaction or other emergency that can't wait, call 9-1-1 or get the person to the nearest hospital. Otherwise, call your doctor.  Reactions should be reported to the Vaccine Adverse Event Reporting System (VAERS). Your doctor should file this report, or you can do it yourself through the VAERS web site at www.vaers.SamedayNews.es, or by calling 847-041-4551.  VAERS does not give medical advice. 6. The National Vaccine Injury Compensation Program The [employer] Vaccine Injury Compensation Program (VICP) is a federal program that was created to compensate people who may have been injured by certain vaccines. Persons who believe they may have been injured by a vaccine can learn about the program and about filing a claim by calling  631-562-9740 or visiting the [employer] website at GoldCloset.com.ee. There is a time limit to file a claim for compensation. 7. How can I learn more?  Ask your healthcare provider. He or she can give you the vaccine package insert or suggest other sources of information.  Call your local or state health department.  Contact the Centers for Disease Control and Prevention (CDC):  Call 870-053-2141 (1-800-CDC-INFO) or  Visit CDC's website at http://hunter.com/ Vaccine Information Statement, PCV13 Vaccine (06/07/2014) This information is not intended to replace advice given to you by your health care provider. Make sure you discuss any questions you have with your health care provider. Document Released: 05/17/2006 Document Revised: 04/09/2016 Document Reviewed: 04/09/2016 Elsevier Interactive Patient Education  2017 [employer].

## 2016-06-20 LAB — LIPID PANEL
CHOLESTEROL TOTAL: 140 mg/dL (ref 100–199)
Chol/HDL Ratio: 3.4 ratio units (ref 0.0–4.4)
HDL: 41 mg/dL (ref >39)
LDL CALC: 70 mg/dL (ref 0–99)
TRIGLYCERIDES: 144 mg/dL (ref 0–149)
VLDL Cholesterol Cal: 29 mg/dL (ref 5–40)

## 2016-06-20 LAB — COMPREHENSIVE METABOLIC PANEL
ALK PHOS: 64 IU/L (ref 39–117)
ALT: 16 IU/L (ref 0–32)
AST: 20 IU/L (ref 0–40)
Albumin/Globulin Ratio: 1.8 (ref 1.2–2.2)
Albumin: 4.6 g/dL (ref 3.6–4.8)
BILIRUBIN TOTAL: 0.4 mg/dL (ref 0.0–1.2)
BUN/Creatinine Ratio: 27 (ref 12–28)
BUN: 19 mg/dL (ref 8–27)
CHLORIDE: 103 mmol/L (ref 96–106)
CO2: 25 mmol/L (ref 18–29)
CREATININE: 0.7 mg/dL (ref 0.57–1.00)
Calcium: 9.5 mg/dL (ref 8.7–10.3)
GFR calc Af Amer: 105 mL/min/{1.73_m2} (ref >59)
GFR calc non Af Amer: 91 mL/min/{1.73_m2} (ref >59)
GLOBULIN, TOTAL: 2.6 g/dL (ref 1.5–4.5)
GLUCOSE: 93 mg/dL (ref 65–99)
Potassium: 4.5 mmol/L (ref 3.5–5.2)
SODIUM: 146 mmol/L — AB (ref 134–144)
Total Protein: 7.2 g/dL (ref 6.0–8.5)

## 2016-06-20 LAB — CBC WITH DIFFERENTIAL/PLATELET
BASOS ABS: 0 10*3/uL (ref 0.0–0.2)
Basos: 0 %
EOS (ABSOLUTE): 0.1 10*3/uL (ref 0.0–0.4)
Eos: 1 %
HEMATOCRIT: 46.9 % — AB (ref 34.0–46.6)
Hemoglobin: 16.4 g/dL — ABNORMAL HIGH (ref 11.1–15.9)
Immature Grans (Abs): 0 10*3/uL (ref 0.0–0.1)
Immature Granulocytes: 0 %
LYMPHS ABS: 2.2 10*3/uL (ref 0.7–3.1)
Lymphs: 21 %
MCH: 31.4 pg (ref 26.6–33.0)
MCHC: 35 g/dL (ref 31.5–35.7)
MCV: 90 fL (ref 79–97)
MONOS ABS: 0.4 10*3/uL (ref 0.1–0.9)
Monocytes: 4 %
Neutrophils Absolute: 7.7 10*3/uL — ABNORMAL HIGH (ref 1.4–7.0)
Neutrophils: 74 %
Platelets: 215 10*3/uL (ref 150–379)
RBC: 5.23 x10E6/uL (ref 3.77–5.28)
RDW: 13.4 % (ref 12.3–15.4)
WBC: 10.5 10*3/uL (ref 3.4–10.8)

## 2016-06-20 LAB — TSH: TSH: 0.817 u[IU]/mL (ref 0.450–4.500)

## 2016-07-02 ENCOUNTER — Ambulatory Visit (INDEPENDENT_AMBULATORY_CARE_PROVIDER_SITE_OTHER): Payer: Medicare Other | Admitting: Internal Medicine

## 2016-07-02 ENCOUNTER — Encounter: Payer: Self-pay | Admitting: Internal Medicine

## 2016-07-02 VITALS — BP 142/80 | Temp 98.7°F | Ht 63.0 in | Wt 127.0 lb

## 2016-07-02 DIAGNOSIS — J0100 Acute maxillary sinusitis, unspecified: Secondary | ICD-10-CM | POA: Diagnosis not present

## 2016-07-02 MED ORDER — DOXYCYCLINE HYCLATE 100 MG PO TABS: 100.0000 mg | ORAL_TABLET | Freq: Two times a day (BID) | ORAL | 0 refills | Status: DC

## 2016-07-02 MED ORDER — GUAIFENESIN-CODEINE 100-10 MG/5ML PO SYRP: 5.0000 mL | ORAL_SOLUTION | Freq: Three times a day (TID) | ORAL | 0 refills | Status: DC | PRN

## 2016-07-02 NOTE — Progress Notes (Signed)
Date:  07/02/2016   Name:  Casey Lara   DOB:  1950-12-07   MRN:  [id number]   Chief Complaint: Cough (thick green production with chest congestion X 1 week)  Cough  This is a new problem. The current episode started in the past 7 days. The problem has been gradually worsening. The problem occurs every few minutes. The cough is productive of purulent sputum. Associated symptoms include ear congestion, ear pain, headaches, nasal congestion and postnasal drip. Pertinent negatives include no chest pain, chills, fever or wheezing.  Recent CXR was normal other than a calcified granuloma.   Review of Systems  Constitutional: Negative for chills and fever.  HENT: Positive for congestion, ear pain, postnasal drip and sinus pressure.   Eyes: Negative for visual disturbance.  Respiratory: Positive for cough. Negative for choking, chest tightness and wheezing.   Cardiovascular: Negative for chest pain, palpitations and leg swelling.  Neurological: Positive for headaches.    Patient Active Problem List   Diagnosis Date Noted  . Tobacco dependence 06/19/2016  . Adenomatous colon polyp 06/19/2015  . Constipation 06/19/2015  . Bilateral low back pain with left-sided sciatica 06/19/2015  . H/O varicella 04/12/2015  . Obstructive apnea 02/11/2015  . 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 Dyslipidemia 02/11/2015  . Allergic state 02/11/2015  . Essential (primary) hypertension 02/11/2015  . Anxiety 02/11/2015  . Insomnia, persistent 02/11/2015  . CMC arthritis, thumb, degenerative 02/11/2015    Prior to Admission medications   Medication Sig Start Date End Date Taking? Authorizing Provider  amLODipine (NORVASC) 5 MG tablet TAKE 1 TABLET BY MOUTH EVERY DAY 08/16/15  Yes Glean Hess, MD  atorvastatin (LIPITOR) 20 MG tablet TAKE 1 TABLET BY MOUTH AT BEDTIME 11/03/15  Yes Glean Hess, MD  bisoprolol-hydrochlorothiazide [facility]) 10-6.25 MG tablet TAKE 1 TABLET EVERY DAY  08/16/15  Yes Glean Hess, MD  diazepam (VALIUM) 5 MG tablet Take 1 tablet (5 mg total) by mouth 2 (two) times daily. 06/19/16  Yes Glean Hess, MD  fluticasone [facility]) 50 MCG/ACT nasal spray USE 2 SPRAYS IN [facility] NOSTRIL DAILY 01/06/16  Yes Glean Hess, MD  losartan (COZAAR) 50 MG tablet TAKE 1 TABLET EVERY DAY 11/03/15  Yes Glean Hess, MD  VENTOLIN HFA 108 [phone] Base) MCG/ACT inhaler TAKE 2 PUFFS 4 TIMES A DAY 01/06/16  Yes Glean Hess, MD    No Known Allergies  Past Surgical History:  Procedure Laterality Date  . CARPAL TUNNEL RELEASE Left 2014  . COLONOSCOPY  05/2013   serrated adenoma  . LUMBAR DISC SURGERY    . MELANOMA EXCISION  2015    Social History  Substance Use Topics  . Smoking status: Current Every Day Smoker    Packs/day: 1.00    Types: Cigarettes  . Smokeless tobacco: Never Used  . Alcohol use No     Medication list has been reviewed and updated.   Physical Exam  Constitutional: She is oriented to person, place, and time. She appears well-developed and well-nourished.  HENT:  Right Ear: External ear and ear canal normal. Tympanic membrane is retracted. Tympanic membrane is not erythematous.  Left Ear: External ear and ear canal normal. Tympanic membrane is retracted. Tympanic membrane is not erythematous.  Nose: Right sinus exhibits maxillary sinus tenderness. Right sinus exhibits no frontal sinus tenderness. Left sinus exhibits maxillary sinus tenderness. Left sinus exhibits no frontal sinus tenderness.  Mouth/Throat: Uvula is midline and mucous membranes are normal.  No oral lesions. Posterior oropharyngeal erythema present. No oropharyngeal exudate.  Cardiovascular: Normal rate, regular rhythm and normal heart sounds.   Pulmonary/Chest: Effort normal. She has decreased breath sounds. She has no wheezes. She has no rales.  Lymphadenopathy:    She has no cervical adenopathy.  Neurological: She is alert and oriented to person, place, and  time.    BP (!) 142/80   Temp 98.7 F (37.1 C)   Ht 5\' 3"  (1.6 m)   Wt 127 lb (57.6 kg)   SpO2 94%   BMI 22.50 kg/m   Assessment and Plan: 1. Acute non-recurrent maxillary sinusitis Continue flonase; doxy to cover atypical chest pathogens - doxycycline (VIBRA-TABS) 100 MG tablet; Take 1 tablet (100 mg total) by mouth 2 (two) times daily.  Dispense: 20 tablet; Refill: 0 - guaiFENesin-codeine (ROBITUSSIN AC) 100-10 MG/5ML syrup; Take 5 mLs by mouth 3 (three) times daily as needed for cough.  Dispense: 150 mL; Refill: 0   Halina Maidens, MD [facility]  07/02/2016

## 2016-07-08 DIAGNOSIS — Z1283 Encounter for screening for malignant neoplasm of skin: Secondary | ICD-10-CM | POA: Diagnosis not present

## 2016-07-08 DIAGNOSIS — Z08 Encounter for follow-up examination after completed treatment for malignant neoplasm: Secondary | ICD-10-CM | POA: Diagnosis not present

## 2016-07-08 DIAGNOSIS — Z85820 Personal history of malignant melanoma of skin: Secondary | ICD-10-CM | POA: Diagnosis not present

## 2016-07-08 DIAGNOSIS — Z872 Personal history of diseases of the skin and subcutaneous tissue: Secondary | ICD-10-CM | POA: Diagnosis not present

## 2016-07-08 DIAGNOSIS — L908 Other atrophic disorders of skin: Secondary | ICD-10-CM | POA: Diagnosis not present

## 2016-07-08 DIAGNOSIS — Z09 Encounter for follow-up examination after completed treatment for conditions other than malignant neoplasm: Secondary | ICD-10-CM | POA: Diagnosis not present

## 2016-08-07 ENCOUNTER — Telehealth: Payer: Self-pay | Admitting: Internal Medicine

## 2016-08-07 ENCOUNTER — Other Ambulatory Visit: Payer: Self-pay | Admitting: Internal Medicine

## 2016-08-07 DIAGNOSIS — G4700 Insomnia, unspecified: Secondary | ICD-10-CM

## 2016-08-07 MED ORDER — LOSARTAN POTASSIUM 50 MG PO TABS: 50.0000 mg | ORAL_TABLET | Freq: Every day | ORAL | 3 refills | Status: DC

## 2016-08-07 MED ORDER — DIAZEPAM 5 MG PO TABS: 5.0000 mg | ORAL_TABLET | Freq: Two times a day (BID) | ORAL | 1 refills | Status: DC

## 2016-08-07 MED ORDER — ATORVASTATIN CALCIUM 20 MG PO TABS: 20.0000 mg | ORAL_TABLET | Freq: Every day | ORAL | 3 refills | Status: DC

## 2016-08-07 NOTE — Telephone Encounter (Signed)
Pt called need refill on Rx diazepam and B/P medication [facility] [place].Marland KitchenMarland Kitchen

## 2016-08-10 NOTE — Telephone Encounter (Signed)
error 

## 2016-08-13 ENCOUNTER — Telehealth: Payer: Self-pay

## 2016-08-13 MED ORDER — FLUTICASONE PROPIONATE 50 MCG/ACT NA SUSP
2.0000 | Freq: Every day | NASAL | 3 refills | Status: DC
Start: 2016-08-13 — End: 2017-07-31

## 2016-08-13 MED ORDER — ALBUTEROL SULFATE HFA 108 (90 BASE) MCG/ACT IN AERS: INHALATION_SPRAY | RESPIRATORY_TRACT | 2 refills | Status: DC

## 2016-08-25 ENCOUNTER — Encounter: Payer: Self-pay | Admitting: Internal Medicine

## 2016-08-25 ENCOUNTER — Ambulatory Visit (INDEPENDENT_AMBULATORY_CARE_PROVIDER_SITE_OTHER): Payer: Medicare Other | Admitting: Internal Medicine

## 2016-08-25 VITALS — BP 98/64 | HR 79 | Temp 98.6°F | Ht 63.0 in | Wt 121.0 lb

## 2016-08-25 DIAGNOSIS — G4700 Insomnia, unspecified: Secondary | ICD-10-CM

## 2016-08-25 DIAGNOSIS — J40 Bronchitis, not specified as acute or chronic: Secondary | ICD-10-CM

## 2016-08-25 MED ORDER — DIAZEPAM 5 MG PO TABS: 5.0000 mg | ORAL_TABLET | Freq: Two times a day (BID) | ORAL | 5 refills | Status: DC

## 2016-08-25 MED ORDER — LEVOFLOXACIN 500 MG PO TABS: 500.0000 mg | ORAL_TABLET | Freq: Every day | ORAL | 0 refills | Status: DC

## 2016-08-25 MED ORDER — GUAIFENESIN-CODEINE 100-10 MG/5ML PO SYRP: 5.0000 mL | ORAL_SOLUTION | Freq: Three times a day (TID) | ORAL | 0 refills | Status: DC | PRN
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
## 2016-08-25 NOTE — Patient Instructions (Signed)

## 2016-08-26 NOTE — Telephone Encounter (Signed)
error 

## 2016-08-31 ENCOUNTER — Other Ambulatory Visit: Payer: Self-pay | Admitting: Internal Medicine

## 2016-08-31 MED ORDER — NYSTATIN 100000 UNIT/ML MT SUSP: 5.0000 mL | Freq: Four times a day (QID) | OROMUCOSAL | 0 refills | Status: DC

## 2016-09-09 ENCOUNTER — Ambulatory Visit
Admission: RE | Admit: 2016-09-09 | Discharge: 2016-09-09 | Disposition: A | Discharge status: Home / Self Care | Payer: Medicare Other | Source: Ambulatory Visit | Attending: Internal Medicine | Admitting: Internal Medicine

## 2016-09-09 DIAGNOSIS — Z1231 Encounter for screening mammogram for malignant neoplasm of breast: Secondary | ICD-10-CM | POA: Diagnosis not present

## 2016-10-26 ENCOUNTER — Telehealth: Payer: Self-pay

## 2016-10-26 ENCOUNTER — Other Ambulatory Visit: Payer: Self-pay | Admitting: Internal Medicine

## 2016-10-26 DIAGNOSIS — G4700 Insomnia, unspecified: Secondary | ICD-10-CM

## 2016-10-26 MED ORDER — DIAZEPAM 5 MG PO TABS: 5.0000 mg | ORAL_TABLET | Freq: Two times a day (BID) | ORAL | 5 refills | Status: DC

## 2016-10-26 NOTE — Telephone Encounter (Signed)
Pt informed

## 2016-10-26 NOTE — Telephone Encounter (Signed)
Pt called stating she needs new Rx for Diazepam. [facility] [place] She told me [facility] [place] to a pharmacy [facility] [place] for when she was in [place]. She called the [facility] and they will not transfer her medication back. [facility] [place].

## 2016-10-26 NOTE — Telephone Encounter (Signed)
Printed and ready to be faxed.

## 2016-11-16 DIAGNOSIS — H04123 Dry eye syndrome of bilateral lacrimal glands: Secondary | ICD-10-CM | POA: Diagnosis not present

## 2016-12-14 ENCOUNTER — Ambulatory Visit (INDEPENDENT_AMBULATORY_CARE_PROVIDER_SITE_OTHER): Payer: Medicare Other | Admitting: Internal Medicine

## 2016-12-14 ENCOUNTER — Encounter: Payer: Self-pay | Admitting: Internal Medicine

## 2016-12-14 VITALS — BP 124/62 | HR 62 | Temp 98.4°F | Ht 63.0 in | Wt 130.2 lb

## 2016-12-14 DIAGNOSIS — I10 Essential (primary) hypertension: Secondary | ICD-10-CM | POA: Diagnosis not present

## 2016-12-14 DIAGNOSIS — J019 Acute sinusitis, unspecified: Secondary | ICD-10-CM | POA: Diagnosis not present

## 2016-12-14 MED ORDER — AMOXICILLIN-POT CLAVULANATE 875-125 MG PO TABS: 1.0000 | ORAL_TABLET | Freq: Two times a day (BID) | ORAL | 0 refills | Status: DC

## 2016-12-14 NOTE — Patient Instructions (Signed)
Symbicort - one puff twice a day - rinse mouth after use

## 2016-12-14 NOTE — Progress Notes (Signed)
Date:  12/14/2016   Name:  Casey Lara   DOB:  May 22, 1951   MRN:  [id number]   Chief Complaint: Hypertension and Sinusitis (Coughing up yellow production. Feels like pressure in face- ear feels clogged up too. ) Hypertension  This is a chronic problem. The problem is controlled. Associated symptoms include shortness of breath. Pertinent negatives include no chest pain, headaches or palpitations.  Sinusitis  This is a new problem. The current episode started in the past 7 days. The problem has been gradually worsening since onset. There has been no fever. Associated symptoms include congestion, coughing, shortness of breath and sinus pressure. Pertinent negatives include no chills or headaches.  Still coughing at times bringing up green mucus.  Using albuterol MDI as needed but no other inhaler.  Still smoking too.  Under stress - her son died in October 06, 2022 of unknown cause - still waiting for autopsy.  His twin is not doing well and she is worried for his mental health.    Review of Systems  Constitutional: Positive for fatigue. Negative for chills and fever.  HENT: Positive for congestion and sinus pressure. Negative for trouble swallowing.   Respiratory: Positive for cough, chest tightness and shortness of breath. Negative for wheezing.   Cardiovascular: Negative for chest pain and palpitations.  Gastrointestinal: Negative for abdominal pain.  Allergic/Immunologic: Negative for environmental allergies.  Neurological: Negative for dizziness and headaches.  Psychiatric/Behavioral: Positive for dysphoric mood and sleep disturbance.    Patient Active Problem List   Diagnosis Date Noted  . Tobacco dependence 06/19/2016  . Adenomatous colon polyp 06/19/2015  . Constipation 06/19/2015  . Bilateral low back pain with left-sided sciatica 06/19/2015  . H/O varicella 04/12/2015  . Obstructive apnea 02/11/2015  . 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 Dyslipidemia 02/11/2015  .  Allergic state 02/11/2015  . Essential (primary) hypertension 02/11/2015  . Anxiety 02/11/2015  . Insomnia, persistent 02/11/2015  . CMC arthritis, thumb, degenerative 02/11/2015    Prior to Admission medications   Medication Sig Start Date End Date Taking? Authorizing Provider  albuterol (VENTOLIN HFA) 108 (90 Base) MCG/ACT inhaler TAKE 2 PUFFS 4 TIMES A DAY 08/13/16  Yes Glean Hess, MD  amLODipine (NORVASC) 5 MG tablet TAKE 1 TABLET BY MOUTH EVERY DAY 08/07/16  Yes Glean Hess, MD  atorvastatin (LIPITOR) 20 MG tablet Take 1 tablet (20 mg total) by mouth at bedtime. 08/07/16  Yes Glean Hess, MD  bisoprolol-hydrochlorothiazide [facility]) 10-6.25 MG tablet TAKE 1 TABLET EVERY DAY 08/07/16  Yes Glean Hess, MD  diazepam (VALIUM) 5 MG tablet Take 1 tablet (5 mg total) by mouth 2 (two) times daily. 10/26/16  Yes Glean Hess, MD  fluticasone [facility]) 50 MCG/ACT nasal spray Place 2 sprays into both nostrils daily. 08/13/16  Yes Glean Hess, MD  losartan (COZAAR) 50 MG tablet Take 1 tablet (50 mg total) by mouth daily. 08/07/16  Yes Glean Hess, MD    No Known Allergies  Past Surgical History:  Procedure Laterality Date  . CARPAL TUNNEL RELEASE Left 2014  . COLONOSCOPY  05/2013   serrated adenoma  . LUMBAR DISC SURGERY    . MELANOMA EXCISION  2015    Social History  Substance Use Topics  . Smoking status: Current Every Day Smoker    Packs/day: 1.00    Types: Cigarettes  . Smokeless tobacco: Never Used  . Alcohol use No     Medication list has been reviewed  and updated.   Physical Exam  Constitutional: She is oriented to person, place, and time. She appears well-developed and well-nourished.  HENT:  Right Ear: External ear and ear canal normal. Tympanic membrane is not erythematous and not retracted.  Left Ear: External ear and ear canal normal. Tympanic membrane is not erythematous and not retracted.  Nose: Right sinus exhibits maxillary sinus  tenderness and frontal sinus tenderness. Left sinus exhibits maxillary sinus tenderness and frontal sinus tenderness.  Mouth/Throat: Uvula is midline and mucous membranes are normal. No oral lesions. No oropharyngeal exudate or posterior oropharyngeal erythema.  Neck: Normal range of motion. Neck supple. No thyromegaly present.  Cardiovascular: Normal rate, regular rhythm and normal heart sounds.   Pulmonary/Chest: She has decreased breath sounds. She has no wheezes. She has no rales.  Lymphadenopathy:    She has no cervical adenopathy.  Neurological: She is alert and oriented to person, place, and time.    BP 124/62 (BP Location: Right Arm, Patient Position: Sitting, Cuff Size: Normal)   Pulse 62   Temp 98.4 F (36.9 C)   Ht 5\' 3"  (1.6 m)   Wt 130 lb 3.2 oz (59.1 kg)   SpO2 93%   BMI 23.06 kg/m   Assessment and Plan: 1. Essential (primary) hypertension controlled  2. Acute non-recurrent sinusitis, unspecified location Symbicort MDI - bid (samples)   Meds ordered this encounter  Medications  . amoxicillin-clavulanate (AUGMENTIN) 875-125 MG tablet    Sig: Take 1 tablet by mouth 2 (two) times daily.    Dispense:  20 tablet    Refill:  0    Halina Maidens, MD [facility]  12/14/2016

## 2016-12-25 ENCOUNTER — Telehealth: Payer: Self-pay | Admitting: Internal Medicine

## 2016-12-25 NOTE — Telephone Encounter (Signed)
Casey Lara ask to be prescribed a different medicine, for the last few days she has developed thrush from the current medication-please advise.

## 2016-12-29 NOTE — Telephone Encounter (Signed)
Pt requesting diff meds.

## 2016-12-29 NOTE — Telephone Encounter (Signed)
Unclear which medication she is referring to... Is it the Augmentin or Symbicort? Looks like she should be done with Augmentin right now so I'm assuming it's Symbicort. Most step up medications after albuterol have inhaled steroids which will cause thrush if mouth is not washed out immediately after usage. She needs to be sure and do this. If she thinks it's thrush she needs to come in. Hesitant to change as this was a sample listed under sinusitis and patient doesn't have asthma or COPD. Thank you.

## 2016-12-30 NOTE — Telephone Encounter (Signed)
Called and left pt VM asking which medication is causing the issue because we are unsure. Awaiting for pt callback to resolve issue and discuss.

## 2017-03-02 ENCOUNTER — Telehealth: Payer: Self-pay

## 2017-03-02 ENCOUNTER — Telehealth: Payer: Self-pay | Admitting: Internal Medicine

## 2017-03-02 NOTE — Telephone Encounter (Signed)
Patient called requesting to get change of medication - Valium - she states it is keeping her up at night. Informed pt Casey Lara will not be able to help with change of this medication. She will have to wait til she returns from [place]. Spoke with Dr Ronnald Ramp about this- she states patient can try to take a half a tablet at bedtime to see if this helps. Pt verbalized understanding with Estill Bamberg and will call back on the 6th.

## 2017-03-02 NOTE — Telephone Encounter (Signed)
Patient is too call back on Monday Aug 6 to speak with Dr Casey Lara about medication change.

## 2017-03-02 NOTE — Telephone Encounter (Signed)
PATIENT WALKED IN TO THE [facility] TO ASK IF THERE ARE OTHER GENERIC NAMES FOR THIS MEDICINE( diazepam (VALIUM) 5 MG tablet) [[id number] IF SO SHE COMPLAINS OF IT KEEPING HER UP AT NIGHT AND WOULD NEED A DIFFERENT MEDICINE-PLEASE ADVISE  [id number] [address], [place] DEA #:  [id number]

## 2017-03-02 NOTE — Telephone Encounter (Signed)
Dr. Army Melia is not going to make a change on this medication because its controlled without seeing the patient. She would need to schedule appt with Dr Army Melia - Diazepam is generic for Valium. Please schedule pt appt for this change to be made.

## 2017-05-28 ENCOUNTER — Other Ambulatory Visit: Payer: Self-pay | Admitting: Internal Medicine

## 2017-05-28 ENCOUNTER — Telehealth: Payer: Self-pay

## 2017-05-28 DIAGNOSIS — G4700 Insomnia, unspecified: Secondary | ICD-10-CM

## 2017-05-28 NOTE — Telephone Encounter (Signed)
Patient called to request refill on Diazepam 5 mg- [facility] [place]. Please Advise.

## 2017-06-25 ENCOUNTER — Encounter: Payer: Medicare Other | Admitting: Internal Medicine

## 2017-07-19 ENCOUNTER — Other Ambulatory Visit: Payer: Self-pay | Admitting: Internal Medicine

## 2017-07-21 NOTE — Telephone Encounter (Signed)
lvm to set up appt

## 2017-07-31 ENCOUNTER — Other Ambulatory Visit: Payer: Self-pay | Admitting: Internal Medicine

## 2017-08-06 ENCOUNTER — Telehealth: Payer: Self-pay

## 2017-08-06 NOTE — Telephone Encounter (Signed)
Called pt to schedule AWV w/ [facility]. Unable to complete AWV on same day as her CPE w/ Dr. Army Melia on 08/16/17. Was hopeful to schedule her AWV w/ [facility] sometime next week. LVM requesting returned call.

## 2017-08-16 ENCOUNTER — Encounter: Payer: Medicare Other | Admitting: Internal Medicine

## 2017-08-19 ENCOUNTER — Ambulatory Visit (INDEPENDENT_AMBULATORY_CARE_PROVIDER_SITE_OTHER): Payer: Medicare Other | Admitting: Internal Medicine

## 2017-08-19 ENCOUNTER — Encounter: Payer: Self-pay | Admitting: Internal Medicine

## 2017-08-19 VITALS — BP 100/60 | HR 67 | Ht 63.0 in | Wt 133.0 lb

## 2017-08-19 DIAGNOSIS — Z23 Encounter for immunization: Secondary | ICD-10-CM

## 2017-08-19 DIAGNOSIS — G4700 Insomnia, unspecified: Secondary | ICD-10-CM

## 2017-08-19 DIAGNOSIS — F17200 Nicotine dependence, unspecified, uncomplicated: Secondary | ICD-10-CM

## 2017-08-19 DIAGNOSIS — Z0000 Encounter for general adult medical examination without abnormal findings: Secondary | ICD-10-CM

## 2017-08-19 DIAGNOSIS — E785 Hyperlipidemia, unspecified: Secondary | ICD-10-CM

## 2017-08-19 DIAGNOSIS — I10 Essential (primary) hypertension: Secondary | ICD-10-CM

## 2017-08-19 DIAGNOSIS — Z1231 Encounter for screening mammogram for malignant neoplasm of breast: Secondary | ICD-10-CM

## 2017-08-19 DIAGNOSIS — G4733 Obstructive sleep apnea (adult) (pediatric): Secondary | ICD-10-CM

## 2017-08-19 LAB — POCT URINALYSIS DIPSTICK
Bilirubin, UA: NEGATIVE
Blood, UA: NEGATIVE
Glucose, UA: NEGATIVE
Ketones, UA: NEGATIVE
LEUKOCYTES UA: NEGATIVE
NITRITE UA: NEGATIVE
PROTEIN UA: NEGATIVE
Spec Grav, UA: 1.015 (ref 1.010–1.025)
Urobilinogen, UA: 0.2 E.U./dL
pH, UA: 6 (ref 5.0–8.0)

## 2017-08-19 MED ORDER — ATORVASTATIN CALCIUM 20 MG PO TABS: 20.0000 mg | ORAL_TABLET | Freq: Every day | ORAL | 3 refills | Status: DC

## 2017-08-19 MED ORDER — BISOPROLOL-HYDROCHLOROTHIAZIDE 10-6.25 MG PO TABS: 1.0000 | ORAL_TABLET | Freq: Every day | ORAL | 3 refills | Status: DC

## 2017-08-19 MED ORDER — DIAZEPAM 5 MG PO TABS: 5.0000 mg | ORAL_TABLET | Freq: Two times a day (BID) | ORAL | 5 refills | Status: DC

## 2017-08-19 MED ORDER — LOSARTAN POTASSIUM 50 MG PO TABS: 50.0000 mg | ORAL_TABLET | Freq: Every day | ORAL | 3 refills | Status: DC
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
## 2017-08-20 ENCOUNTER — Other Ambulatory Visit: Payer: Self-pay | Admitting: Internal Medicine

## 2017-08-20 LAB — CBC WITH DIFFERENTIAL/PLATELET
BASOS: 0 %
Basophils Absolute: 0 10*3/uL (ref 0.0–0.2)
EOS (ABSOLUTE): 0.1 10*3/uL (ref 0.0–0.4)
EOS: 1 %
HEMATOCRIT: 45.7 % (ref 34.0–46.6)
Hemoglobin: 16.6 g/dL — ABNORMAL HIGH (ref 11.1–15.9)
IMMATURE GRANS (ABS): 0 10*3/uL (ref 0.0–0.1)
IMMATURE GRANULOCYTES: 0 %
LYMPHS: 33 %
Lymphocytes Absolute: 2.5 10*3/uL (ref 0.7–3.1)
MCH: 31.7 pg (ref 26.6–33.0)
MCHC: 36.3 g/dL — ABNORMAL HIGH (ref 31.5–35.7)
MCV: 87 fL (ref 79–97)
Monocytes Absolute: 0.4 10*3/uL (ref 0.1–0.9)
Monocytes: 6 %
NEUTROS PCT: 60 %
Neutrophils Absolute: 4.6 10*3/uL (ref 1.4–7.0)
PLATELETS: 214 10*3/uL (ref 150–379)
RBC: 5.23 x10E6/uL (ref 3.77–5.28)
RDW: 13 % (ref 12.3–15.4)
WBC: 7.7 10*3/uL (ref 3.4–10.8)

## 2017-08-20 LAB — COMPREHENSIVE METABOLIC PANEL
ALT: 21 IU/L (ref 0–32)
AST: 26 IU/L (ref 0–40)
Albumin/Globulin Ratio: 1.6 (ref 1.2–2.2)
Albumin: 4.5 g/dL (ref 3.6–4.8)
Alkaline Phosphatase: 72 IU/L (ref 39–117)
BUN/Creatinine Ratio: 25 (ref 12–28)
BUN: 21 mg/dL (ref 8–27)
Bilirubin Total: 0.6 mg/dL (ref 0.0–1.2)
CALCIUM: 9.7 mg/dL (ref 8.7–10.3)
CO2: 23 mmol/L (ref 20–29)
CREATININE: 0.85 mg/dL (ref 0.57–1.00)
Chloride: 99 mmol/L (ref 96–106)
GFR, EST AFRICAN AMERICAN: 83 mL/min/{1.73_m2} (ref >59)
GFR, EST NON AFRICAN AMERICAN: 72 mL/min/{1.73_m2} (ref >59)
GLOBULIN, TOTAL: 2.9 g/dL (ref 1.5–4.5)
Glucose: 103 mg/dL — ABNORMAL HIGH (ref 65–99)
POTASSIUM: 4.1 mmol/L (ref 3.5–5.2)
Sodium: 146 mmol/L — ABNORMAL HIGH (ref 134–144)
TOTAL PROTEIN: 7.4 g/dL (ref 6.0–8.5)

## 2017-08-20 LAB — LIPID PANEL
Chol/HDL Ratio: 4.2 ratio (ref 0.0–4.4)
Cholesterol, Total: 142 mg/dL (ref 100–199)
HDL: 34 mg/dL — AB (ref >39)
LDL CALC: 66 mg/dL (ref 0–99)
TRIGLYCERIDES: 211 mg/dL — AB (ref 0–149)
VLDL CHOLESTEROL CAL: 42 mg/dL — AB (ref 5–40)

## 2017-08-20 LAB — TSH: TSH: 1.33 u[IU]/mL (ref 0.450–4.500)

## 2017-08-31 NOTE — Telephone Encounter (Signed)
Patient scheduled January 31 with wellness nurse.

## 2017-09-02 ENCOUNTER — Ambulatory Visit: Payer: Medicare Other

## 2017-09-14 ENCOUNTER — Telehealth: Payer: Self-pay

## 2017-09-14 NOTE — Telephone Encounter (Signed)
Patient called requesting early refills for Diazepam medication. She stated she is going [place] of [place] starting [place] and will not be back until the 23rd. Wanted to know if we would call pharmacy to have filled early.  Spoke with Dr Army Melia she gave verbal okay. [facility] [place] and gave verbal approval to fill today for patient. They said they will fill.

## 2017-10-04 DIAGNOSIS — L03031 Cellulitis of right toe: Secondary | ICD-10-CM | POA: Diagnosis not present

## 2017-10-18 DIAGNOSIS — L03031 Cellulitis of right toe: Secondary | ICD-10-CM | POA: Diagnosis not present

## 2017-11-23 ENCOUNTER — Encounter (INDEPENDENT_AMBULATORY_CARE_PROVIDER_SITE_OTHER): Payer: Self-pay

## 2017-11-23 ENCOUNTER — Ambulatory Visit
Admission: RE | Admit: 2017-11-23 | Discharge: 2017-11-23 | Disposition: A | Discharge status: Home / Self Care | Payer: Medicare Other | Source: Ambulatory Visit | Attending: Internal Medicine | Admitting: Internal Medicine

## 2017-11-23 DIAGNOSIS — Z1231 Encounter for screening mammogram for malignant neoplasm of breast: Secondary | ICD-10-CM | POA: Insufficient documentation

## 2017-11-27 ENCOUNTER — Other Ambulatory Visit: Payer: Self-pay | Admitting: Internal Medicine

## 2018-01-05 ENCOUNTER — Other Ambulatory Visit: Payer: Self-pay | Admitting: Internal Medicine

## 2018-02-11 ENCOUNTER — Other Ambulatory Visit: Payer: Self-pay

## 2018-02-11 ENCOUNTER — Other Ambulatory Visit: Payer: Self-pay | Admitting: Internal Medicine

## 2018-02-11 DIAGNOSIS — G4700 Insomnia, unspecified: Secondary | ICD-10-CM

## 2018-02-11 MED ORDER — DIAZEPAM 5 MG PO TABS: 5.0000 mg | ORAL_TABLET | Freq: Two times a day (BID) | ORAL | 2 refills | Status: DC

## 2018-02-11 NOTE — Telephone Encounter (Signed)
Patient would like a new Rx regarding new pharmacy change. Please advise

## 2018-02-16 ENCOUNTER — Ambulatory Visit: Payer: Medicare Other | Admitting: Internal Medicine

## 2018-05-09 ENCOUNTER — Other Ambulatory Visit: Payer: Self-pay | Admitting: Internal Medicine

## 2018-05-09 DIAGNOSIS — G4700 Insomnia, unspecified: Secondary | ICD-10-CM

## 2018-05-11 DIAGNOSIS — H43392 Other vitreous opacities, left eye: Secondary | ICD-10-CM | POA: Diagnosis not present

## 2018-06-10 ENCOUNTER — Other Ambulatory Visit: Payer: Self-pay | Admitting: Internal Medicine

## 2018-06-10 DIAGNOSIS — E785 Hyperlipidemia, unspecified: Secondary | ICD-10-CM

## 2018-06-14 ENCOUNTER — Ambulatory Visit (INDEPENDENT_AMBULATORY_CARE_PROVIDER_SITE_OTHER): Payer: Medicare Other | Admitting: Internal Medicine

## 2018-06-14 ENCOUNTER — Encounter: Payer: Self-pay | Admitting: Internal Medicine

## 2018-06-14 VITALS — BP 112/66 | HR 64 | Ht 63.0 in | Wt 128.8 lb

## 2018-06-14 DIAGNOSIS — Z23 Encounter for immunization: Secondary | ICD-10-CM

## 2018-06-14 DIAGNOSIS — H6983 Other specified disorders of Eustachian tube, bilateral: Secondary | ICD-10-CM | POA: Diagnosis not present

## 2018-06-14 MED ORDER — PREDNISONE 10 MG PO TABS: 10.0000 mg | ORAL_TABLET | ORAL | 0 refills | Status: AC

## 2018-06-14 NOTE — Progress Notes (Signed)
Date:  06/14/2018   Name:  Casey Lara   DOB:  1951/07/26   MRN:  [id number]   Chief Complaint: Otalgia (Left ear pain. Water is getting in ear after shower. Stopped up. ) and Immunizations (High flu shot. )  Otalgia   There is pain in the left ear. This is a new problem. The problem occurs constantly. The problem has been unchanged. The patient is experiencing no pain. Associated symptoms include hearing loss (and ear fullness). Pertinent negatives include no ear discharge, headaches or sore throat. She has tried nothing for the symptoms.    Review of Systems  Constitutional: Negative for chills, fatigue and fever.  HENT: Positive for ear pain and hearing loss (and ear fullness). Negative for congestion, ear discharge, sore throat, tinnitus and trouble swallowing.   Respiratory: Negative for chest tightness and shortness of breath.   Cardiovascular: Negative for chest pain.  Allergic/Immunologic: Negative for environmental allergies.  Neurological: Negative for dizziness and headaches.    Patient Active Problem List   Diagnosis Date Noted  . Tobacco dependence 06/19/2016  . Adenomatous colon polyp 06/19/2015  . Constipation 06/19/2015  . Bilateral low back pain with left-sided sciatica 06/19/2015  . H/O varicella 04/12/2015  . Obstructive apnea 02/11/2015  . History of malignant melanoma of skin 02/11/2015  . Dyslipidemia 02/11/2015  . Allergic state 02/11/2015  . Essential (primary) hypertension 02/11/2015  . Anxiety 02/11/2015  . Insomnia, persistent 02/11/2015  . CMC arthritis, thumb, degenerative 02/11/2015    No Known Allergies  Past Surgical History:  Procedure Laterality Date  . CARPAL TUNNEL RELEASE Left 2014  . COLONOSCOPY  05/2013   serrated adenoma  . LUMBAR DISC SURGERY    . MELANOMA EXCISION  2015    Social History   Tobacco Use  . Smoking status: Current Every Day Smoker    Packs/day: 1.00    Types: Cigarettes  . Smokeless tobacco:  Never Used  Substance Use Topics  . Alcohol use: No    Alcohol/week: 0.0 standard drinks  . Drug use: No     Medication list has been reviewed and updated.  Current Meds  Medication Sig  . albuterol (VENTOLIN HFA) 108 (90 Base) MCG/ACT inhaler TAKE 2 PUFFS 4 TIMES A DAY  . amLODipine (NORVASC) 5 MG tablet TAKE 1 TABLET BY MOUTH EVERY DAY  . atorvastatin (LIPITOR) 20 MG tablet TAKE 1 TABLET BY MOUTH EVERYDAY AT BEDTIME  . bisoprolol-hydrochlorothiazide (ZIAC) 10-6.25 MG tablet Take 1 tablet by mouth daily.  . diazepam (VALIUM) 5 MG tablet TAKE 1 TABLET BY MOUTH TWICE A DAY  . fluticasone (FLONASE) 50 MCG/ACT nasal spray SPRAY 2 SPRAYS INTO EACH NOSTRIL EVERY DAY  . losartan (COZAAR) 50 MG tablet Take 1 tablet (50 mg total) by mouth daily.    PHQ 2/9 Scores 08/19/2017 06/19/2016  PHQ - 2 Score 0 0    Physical Exam  Constitutional: She is oriented to person, place, and time. She appears well-developed. No distress.  HENT:  Head: Normocephalic and atraumatic.  Right Ear: Ear canal normal. No drainage. Tympanic membrane is erythematous and retracted. No middle ear effusion.  Left Ear: Ear canal normal. No drainage. Tympanic membrane is erythematous and retracted.  No middle ear effusion.  Nose: Right sinus exhibits no maxillary sinus tenderness. Left sinus exhibits no maxillary sinus tenderness.  Mouth/Throat: No posterior oropharyngeal edema or posterior oropharyngeal erythema.  Cardiovascular: Normal rate, regular rhythm and normal heart sounds.  Pulmonary/Chest: Effort normal and breath sounds  normal. No respiratory distress.  Musculoskeletal: Normal range of motion.  Neurological: She is alert and oriented to person, place, and time.  Skin: Skin is warm and dry. No rash noted.  Psychiatric: She has a normal mood and affect. Her behavior is normal. Thought content normal.  Nursing note and vitals reviewed.   BP 112/66 (BP Location: Right Arm, Patient Position: Sitting, Cuff  Size: Normal)   Pulse 64   Ht 5\' 3"  (1.6 m)   Wt 128 lb 12.8 oz (58.4 kg)   SpO2 94%   BMI 22.82 kg/m   Assessment and Plan: 1. Eustachian tube dysfunction, bilateral Continue flonase and sudafed - predniSONE (DELTASONE) 10 MG tablet; Take 1 tablet (10 mg total) by mouth as directed for 6 days. Take 6,5,4,3,2,1 then stop  Dispense: 21 tablet; Refill: 0   Partially dictated using Editor, commissioning. Any errors are unintentional.  Halina Maidens, MD [facility]  06/14/2018

## 2018-06-14 NOTE — Patient Instructions (Signed)
Continue flonase spray and sudafed for ear congestion

## 2018-08-04 ENCOUNTER — Other Ambulatory Visit: Payer: Self-pay | Admitting: Internal Medicine

## 2018-08-04 DIAGNOSIS — G4700 Insomnia, unspecified: Secondary | ICD-10-CM

## 2018-08-22 ENCOUNTER — Encounter: Payer: Self-pay | Admitting: Internal Medicine

## 2018-08-22 ENCOUNTER — Telehealth: Payer: Self-pay | Admitting: *Deleted

## 2018-08-22 ENCOUNTER — Ambulatory Visit (INDEPENDENT_AMBULATORY_CARE_PROVIDER_SITE_OTHER): Payer: Medicare Other | Admitting: Internal Medicine

## 2018-08-22 VITALS — BP 118/68 | HR 55 | Ht 63.0 in | Wt 127.0 lb

## 2018-08-22 DIAGNOSIS — I10 Essential (primary) hypertension: Secondary | ICD-10-CM

## 2018-08-22 DIAGNOSIS — Z23 Encounter for immunization: Secondary | ICD-10-CM | POA: Diagnosis not present

## 2018-08-22 DIAGNOSIS — R413 Other amnesia: Secondary | ICD-10-CM | POA: Diagnosis not present

## 2018-08-22 DIAGNOSIS — Z1231 Encounter for screening mammogram for malignant neoplasm of breast: Secondary | ICD-10-CM | POA: Diagnosis not present

## 2018-08-22 DIAGNOSIS — G4733 Obstructive sleep apnea (adult) (pediatric): Secondary | ICD-10-CM

## 2018-08-22 DIAGNOSIS — F419 Anxiety disorder, unspecified: Secondary | ICD-10-CM

## 2018-08-22 DIAGNOSIS — E785 Hyperlipidemia, unspecified: Secondary | ICD-10-CM | POA: Diagnosis not present

## 2018-08-22 DIAGNOSIS — F17200 Nicotine dependence, unspecified, uncomplicated: Secondary | ICD-10-CM

## 2018-08-22 LAB — POCT URINALYSIS DIPSTICK
Bilirubin, UA: NEGATIVE
Blood, UA: NEGATIVE
Glucose, UA: NEGATIVE
Ketones, UA: NEGATIVE
LEUKOCYTES UA: NEGATIVE
Nitrite, UA: NEGATIVE
Protein, UA: NEGATIVE
Spec Grav, UA: <=1.005 — AB (ref 1.010–1.025)
Urobilinogen, UA: 0.2 E.U./dL
pH, UA: 5 (ref 5.0–8.0)

## 2018-08-22 MED ORDER — BISOPROLOL-HYDROCHLOROTHIAZIDE 10-6.25 MG PO TABS: 1.0000 | ORAL_TABLET | Freq: Every day | ORAL | 3 refills | Status: DC

## 2018-08-22 MED ORDER — ATORVASTATIN CALCIUM 20 MG PO TABS: 20.0000 mg | ORAL_TABLET | Freq: Every day | ORAL | 3 refills | Status: DC

## 2018-08-22 MED ORDER — AMLODIPINE BESYLATE 5 MG PO TABS: 5.0000 mg | ORAL_TABLET | Freq: Every day | ORAL | 3 refills | Status: DC

## 2018-08-22 MED ORDER — LOSARTAN POTASSIUM 50 MG PO TABS: 50.0000 mg | ORAL_TABLET | Freq: Every day | ORAL | 3 refills | Status: DC

## 2018-08-22 NOTE — Telephone Encounter (Signed)
Received referral for low dose lung cancer screening CT scan. Message left at phone number listed in EMR for patient to call me back to facilitate scheduling scan.  

## 2018-08-22 NOTE — Patient Instructions (Addendum)
Call to schedule your mammogram in April  Someone from the [facility] will call to schedule the low dose lung CT to screen for cancer  Someone will call you to schedule the sleep study.  Pneumococcal Polysaccharide Vaccine: What You Need to Know 1. Why get vaccinated? Vaccination can protect older adults (and some children and younger adults) from pneumococcal disease. Pneumococcal disease is caused by bacteria that can spread from person to person through close contact. It can cause ear infections, and it can also lead to more serious infections of the:  Lungs (pneumonia),  Blood (bacteremia), and  Covering of the brain and spinal cord (meningitis). Meningitis can cause deafness and brain damage, and it can be fatal. Anyone can get pneumococcal disease, but children under 23 years of age, people with certain medical conditions, adults over 13 years of age, and cigarette smokers are at the highest risk. About 18,000 older adults die each year from pneumococcal disease in the [place]. Treatment of pneumococcal infections with penicillin and other drugs used to be more effective. But some strains of the disease have become resistant to these drugs. This makes prevention of the disease, through vaccination, even more important. 2. Pneumococcal polysaccharide vaccine (PPSV23) Pneumococcal polysaccharide vaccine (PPSV23) protects against 23 types of pneumococcal bacteria. It will not prevent all pneumococcal disease. PPSV23 is recommended for:  All adults 94 years of age and older,  Anyone 2 through 68 years of age with certain long-term health problems,  Anyone 2 through 68 years of age with a weakened immune system,  Adults 56 through 68 years of age who smoke cigarettes or have asthma. Most people need only one dose of PPSV. A second dose is recommended for certain high-risk groups. People 16 and older should get a dose even if they have gotten one or more doses of the  vaccine before they turned 65. Your healthcare provider can give you more information about these recommendations. Most healthy adults develop protection within 2 to 3 weeks of getting the shot. 3. Some people should not get this vaccine  Anyone who has had a life-threatening allergic reaction to PPSV should not get another dose.  Anyone who has a severe allergy to any component of PPSV should not receive it. Tell your provider if you have any severe allergies.  Anyone who is moderately or severely ill when the shot is scheduled may be asked to wait until they recover before getting the vaccine. Someone with a mild illness can usually be vaccinated.  Children less than 52 years of age should not receive this vaccine.  There is no evidence that PPSV is harmful to either a pregnant woman or to her fetus. However, as a precaution, women who need the vaccine should be vaccinated before becoming pregnant, if possible. 4. Risks of a vaccine reaction With any medicine, including vaccines, there is a chance of side effects. These are usually mild and go away on their own, but serious reactions are also possible. About half of people who get PPSV have mild side effects, such as redness or pain where the shot is given, which go away within about two days. Less than 1 out of 100 people develop a fever, muscle aches, or more severe local reactions. Problems that could happen after any vaccine:  People sometimes faint after a medical procedure, including vaccination. Sitting or lying down for about 15 minutes can help prevent fainting, and injuries caused by a fall. Tell your doctor if you  feel dizzy, or have vision changes or ringing in the ears.  Some people get severe pain in the shoulder and have difficulty moving the arm where a shot was given. This happens very rarely.  Any medication can cause a severe allergic reaction. Such reactions from a vaccine are very rare, estimated at about 1 in a million  doses, and would happen within a few minutes to a few hours after the vaccination. As with any medicine, there is a very remote chance of a vaccine causing a serious injury or death. The safety of vaccines is always being monitored. For more information, visit: http://www.aguilar.org/ 5. What if there is a serious reaction? What should I look for? Look for anything that concerns you, such as signs of a severe allergic reaction, very high fever, or unusual behavior. Signs of a severe allergic reaction can include hives, swelling of the face and throat, difficulty breathing, a fast heartbeat, dizziness, and weakness. These would usually start a few minutes to a few hours after the vaccination. What should I do? If you think it is a severe allergic reaction or other emergency that can't wait, call 9-1-1 or get to the nearest hospital. Otherwise, call your doctor. Afterward, the reaction should be reported to the Vaccine Adverse Event Reporting System (VAERS). Your doctor might file this report, or you can do it yourself through the VAERS web site at www.vaers.SamedayNews.es, or by calling 458-046-7775. VAERS does not give medical advice. 6. How can I learn more?  Ask your doctor. He or she can give you the vaccine package insert or suggest other sources of information.  Call your local or state health department.  Contact the Centers for Disease Control and Prevention (CDC): ? Call 816-679-0620 (1-800-CDC-INFO) or ? Visit CDC's website at http://hunter.com/ CDC Vaccine Information Statement PPSV Vaccine (11/24/2013) This information is not intended to replace advice given to you by your health care provider. Make sure you discuss any questions you have with your health care provider. Document Released: 05/17/2006 Document Revised: 03/01/2018 Document Reviewed: 03/01/2018 Elsevier Interactive Patient Education  2019 [employer].
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
## 2018-08-23 ENCOUNTER — Other Ambulatory Visit: Payer: Self-pay | Admitting: Internal Medicine

## 2018-08-24 ENCOUNTER — Telehealth: Payer: Self-pay | Admitting: *Deleted

## 2018-08-24 NOTE — Telephone Encounter (Signed)
Received referral for low dose lung cancer screening CT scan. Message left at phone number listed in EMR for patient to call me back to facilitate scheduling scan.  

## 2018-08-25 ENCOUNTER — Telehealth: Payer: Self-pay | Admitting: *Deleted

## 2018-08-25 DIAGNOSIS — Z122 Encounter for screening for malignant neoplasm of respiratory organs: Secondary | ICD-10-CM

## 2018-08-25 DIAGNOSIS — Z87891 Personal history of nicotine dependence: Secondary | ICD-10-CM

## 2018-08-25 NOTE — Telephone Encounter (Signed)
Received referral for initial lung cancer screening scan. Contacted patient and obtained smoking history,(current, 50 pack year) as well as answering questions related to screening process. Patient denies signs of lung cancer such as weight loss or hemoptysis. Patient denies comorbidity that would prevent curative treatment if lung cancer were found. Patient is scheduled for shared decision making visit and CT scan on 09/01/18 at 145pm.

## 2018-08-30 ENCOUNTER — Other Ambulatory Visit: Payer: Self-pay | Admitting: Internal Medicine

## 2018-08-30 DIAGNOSIS — I10 Essential (primary) hypertension: Secondary | ICD-10-CM

## 2018-08-31 ENCOUNTER — Telehealth: Payer: Self-pay | Admitting: *Deleted

## 2018-08-31 NOTE — Telephone Encounter (Signed)
CALLED PT TO REMIND HER OR HER APPT FOR LDCT SCREENING ON 09-01-2018 @ 1345, VOICE MAIL LEFT FOR PATIENT

## 2018-09-01 ENCOUNTER — Ambulatory Visit
Admission: RE | Admit: 2018-09-01 | Discharge: 2018-09-01 | Disposition: A | Discharge status: Home / Self Care | Payer: Medicare Other | Source: Ambulatory Visit | Attending: Nurse Practitioner | Admitting: Nurse Practitioner

## 2018-09-01 ENCOUNTER — Inpatient Hospital Stay: Payer: Medicare Other | Attending: Nurse Practitioner | Admitting: Hospice and Palliative Medicine

## 2018-09-01 DIAGNOSIS — Z122 Encounter for screening for malignant neoplasm of respiratory organs: Secondary | ICD-10-CM | POA: Diagnosis not present

## 2018-09-01 DIAGNOSIS — Z87891 Personal history of nicotine dependence: Secondary | ICD-10-CM

## 2018-09-01 DIAGNOSIS — F17210 Nicotine dependence, cigarettes, uncomplicated: Secondary | ICD-10-CM | POA: Diagnosis not present

## 2018-09-01 NOTE — Progress Notes (Signed)
In accordance with CMS guidelines, patient has met eligibility criteria including age, absence of signs or symptoms of lung cancer.  Social History   Tobacco Use  . Smoking status: Current Every Day Smoker    Packs/day: 1.00    Years: 50.00    Pack years: 50.00    Types: Cigarettes  . Smokeless tobacco: Never Used  Substance Use Topics  . Alcohol use: No    Alcohol/week: 0.0 standard drinks  . Drug use: No      A shared decision-making session was conducted prior to the performance of CT scan. This includes one or more decision aids, includes benefits and harms of screening, follow-up diagnostic testing, over-diagnosis, false positive rate, and total radiation exposure.   Counseling on the importance of adherence to annual lung cancer LDCT screening, impact of co-morbidities, and ability or willingness to undergo diagnosis and treatment is imperative for compliance of the program.   Counseling on the importance of continued smoking cessation for former smokers; the importance of smoking cessation for current smokers, and information about tobacco cessation interventions have been given to patient including [place] and [facility] quit [place] programs.   Written order for lung cancer screening with LDCT has been given to the patient and any and all questions have been answered to the best of my abilities.    Yearly follow up will be coordinated by Burgess Estelle, Thoracic Navigator.  Altha Harm, PhD, NP-C [place] [facility] 09/01/18 2:24 PM

## 2018-09-05 ENCOUNTER — Encounter: Payer: Self-pay | Admitting: *Deleted

## 2018-09-05 ENCOUNTER — Other Ambulatory Visit: Payer: Self-pay | Admitting: Internal Medicine

## 2018-10-30 ENCOUNTER — Other Ambulatory Visit: Payer: Self-pay | Admitting: Internal Medicine

## 2018-10-30 DIAGNOSIS — G4700 Insomnia, unspecified: Secondary | ICD-10-CM

## 2018-11-14 ENCOUNTER — Other Ambulatory Visit: Payer: Self-pay | Admitting: Internal Medicine

## 2018-11-14 ENCOUNTER — Telehealth: Payer: Self-pay

## 2018-11-14 MED ORDER — ALBUTEROL SULFATE HFA 108 (90 BASE) MCG/ACT IN AERS: INHALATION_SPRAY | RESPIRATORY_TRACT | 5 refills | Status: AC

## 2018-11-14 NOTE — Telephone Encounter (Signed)
Patient called answer service asking for [place] to swap back to old inhaler as the new one is not working. I attempted to call back and advise that insurance and pharmacy may just give what she can get when we write for albuterol. No answer or vm

## 2018-11-15 ENCOUNTER — Telehealth: Payer: Self-pay

## 2018-11-15 NOTE — Telephone Encounter (Signed)
Patient called requesting refill on inhaler. Sent in medication to her pharmacy as requested.

## 2018-12-23 ENCOUNTER — Ambulatory Visit (INDEPENDENT_AMBULATORY_CARE_PROVIDER_SITE_OTHER): Payer: Medicare Other | Admitting: Internal Medicine

## 2018-12-23 ENCOUNTER — Other Ambulatory Visit: Payer: Self-pay

## 2018-12-23 ENCOUNTER — Encounter: Payer: Self-pay | Admitting: Internal Medicine

## 2018-12-23 VITALS — BP 112/68 | HR 63 | Resp 16 | Ht 63.0 in | Wt 142.0 lb

## 2018-12-23 DIAGNOSIS — G4733 Obstructive sleep apnea (adult) (pediatric): Secondary | ICD-10-CM

## 2018-12-23 DIAGNOSIS — F0390 Unspecified dementia without behavioral disturbance: Secondary | ICD-10-CM | POA: Insufficient documentation

## 2018-12-23 DIAGNOSIS — F17200 Nicotine dependence, unspecified, uncomplicated: Secondary | ICD-10-CM

## 2018-12-23 DIAGNOSIS — R413 Other amnesia: Secondary | ICD-10-CM

## 2018-12-23 DIAGNOSIS — G3184 Mild cognitive impairment, so stated: Secondary | ICD-10-CM | POA: Insufficient documentation

## 2018-12-23 DIAGNOSIS — E785 Hyperlipidemia, unspecified: Secondary | ICD-10-CM

## 2018-12-23 DIAGNOSIS — G4700 Insomnia, unspecified: Secondary | ICD-10-CM

## 2018-12-23 DIAGNOSIS — I10 Essential (primary) hypertension: Secondary | ICD-10-CM

## 2018-12-23 MED ORDER — AMLODIPINE BESYLATE 5 MG PO TABS: 5.0000 mg | ORAL_TABLET | Freq: Every day | ORAL | 1 refills | Status: DC
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
## 2018-12-24 LAB — COMPREHENSIVE METABOLIC PANEL
ALT: 28 IU/L (ref 0–32)
AST: 26 IU/L (ref 0–40)
Albumin/Globulin Ratio: 1.4 (ref 1.2–2.2)
Albumin: 4.2 g/dL (ref 3.8–4.8)
Alkaline Phosphatase: 84 IU/L (ref 39–117)
BUN/Creatinine Ratio: 13 (ref 12–28)
BUN: 11 mg/dL (ref 8–27)
Bilirubin Total: 0.4 mg/dL (ref 0.0–1.2)
CO2: 22 mmol/L (ref 20–29)
Calcium: 9.8 mg/dL (ref 8.7–10.3)
Chloride: 98 mmol/L (ref 96–106)
Creatinine, Ser: 0.85 mg/dL (ref 0.57–1.00)
GFR calc Af Amer: 82 mL/min/{1.73_m2} (ref >59)
GFR calc non Af Amer: 71 mL/min/{1.73_m2} (ref >59)
Globulin, Total: 2.9 g/dL (ref 1.5–4.5)
Glucose: 99 mg/dL (ref 65–99)
Potassium: 4.2 mmol/L (ref 3.5–5.2)
Sodium: 140 mmol/L (ref 134–144)
Total Protein: 7.1 g/dL (ref 6.0–8.5)

## 2018-12-24 LAB — CBC WITH DIFFERENTIAL/PLATELET
Basophils Absolute: 0 10*3/uL (ref 0.0–0.2)
Basos: 0 %
EOS (ABSOLUTE): 0 10*3/uL (ref 0.0–0.4)
Eos: 0 %
Hematocrit: 45.9 % (ref 34.0–46.6)
Hemoglobin: 16.1 g/dL — ABNORMAL HIGH (ref 11.1–15.9)
Immature Grans (Abs): 0 10*3/uL (ref 0.0–0.1)
Immature Granulocytes: 0 %
Lymphocytes Absolute: 2.2 10*3/uL (ref 0.7–3.1)
Lymphs: 29 %
MCH: 30.3 pg (ref 26.6–33.0)
MCHC: 35.1 g/dL (ref 31.5–35.7)
MCV: 86 fL (ref 79–97)
Monocytes Absolute: 0.4 10*3/uL (ref 0.1–0.9)
Monocytes: 5 %
Neutrophils Absolute: 5 10*3/uL (ref 1.4–7.0)
Neutrophils: 66 %
Platelets: 290 10*3/uL (ref 150–450)
RBC: 5.32 x10E6/uL — ABNORMAL HIGH (ref 3.77–5.28)
RDW: 11.9 % (ref 11.7–15.4)
WBC: 7.6 10*3/uL (ref 3.4–10.8)

## 2018-12-24 LAB — LIPID PANEL
Chol/HDL Ratio: 4.3 ratio (ref 0.0–4.4)
Cholesterol, Total: 141 mg/dL (ref 100–199)
HDL: 33 mg/dL — ABNORMAL LOW (ref >39)
LDL Calculated: 73 mg/dL (ref 0–99)
Triglycerides: 177 mg/dL — ABNORMAL HIGH (ref 0–149)
VLDL Cholesterol Cal: 35 mg/dL (ref 5–40)

## 2018-12-24 LAB — VITAMIN B12: Vitamin B-12: 441 pg/mL (ref 232–1245)

## 2018-12-24 LAB — TSH+FREE T4
Free T4: 1.41 ng/dL (ref 0.82–1.77)
TSH: 1.62 u[IU]/mL (ref 0.450–4.500)

## 2018-12-24 LAB — SEDIMENTATION RATE: Sed Rate: 4 mm/hr (ref 0–40)

## 2019-01-16 ENCOUNTER — Other Ambulatory Visit: Payer: Self-pay | Admitting: Internal Medicine

## 2019-01-23 ENCOUNTER — Other Ambulatory Visit: Payer: Self-pay | Admitting: Internal Medicine

## 2019-01-23 DIAGNOSIS — G4700 Insomnia, unspecified: Secondary | ICD-10-CM

## 2019-03-27 ENCOUNTER — Other Ambulatory Visit: Payer: Self-pay | Admitting: Internal Medicine

## 2019-07-25 ENCOUNTER — Other Ambulatory Visit: Payer: Self-pay | Admitting: Internal Medicine

## 2019-07-25 DIAGNOSIS — G4700 Insomnia, unspecified: Secondary | ICD-10-CM

## 2019-08-28 ENCOUNTER — Telehealth: Payer: Self-pay | Admitting: *Deleted

## 2019-08-28 NOTE — Telephone Encounter (Signed)
Left message for patient to notify them that it is time to schedule annual low dose lung cancer screening CT scan. Instructed patient to call back to verify information prior to the scan being scheduled.  

## 2019-09-05 ENCOUNTER — Encounter: Payer: Medicare Other | Admitting: Internal Medicine

## 2019-09-08 ENCOUNTER — Telehealth: Payer: Self-pay | Admitting: *Deleted

## 2019-09-08 NOTE — Telephone Encounter (Signed)
Left message for patient to notify them that it is time to schedule annual low dose lung cancer screening CT scan. Instructed patient to call back to verify information prior to the scan being scheduled.  

## 2019-09-17 ENCOUNTER — Other Ambulatory Visit: Payer: Self-pay | Admitting: Internal Medicine

## 2019-09-17 DIAGNOSIS — E785 Hyperlipidemia, unspecified: Secondary | ICD-10-CM

## 2019-09-28 ENCOUNTER — Other Ambulatory Visit: Payer: Self-pay

## 2019-09-28 ENCOUNTER — Ambulatory Visit (INDEPENDENT_AMBULATORY_CARE_PROVIDER_SITE_OTHER): Payer: Medicare Other | Admitting: Internal Medicine

## 2019-09-28 ENCOUNTER — Encounter: Payer: Self-pay | Admitting: Internal Medicine

## 2019-09-28 VITALS — BP 124/78 | HR 84 | Temp 97.0°F | Ht 63.0 in | Wt 157.0 lb

## 2019-09-28 DIAGNOSIS — R413 Other amnesia: Secondary | ICD-10-CM

## 2019-09-28 DIAGNOSIS — I10 Essential (primary) hypertension: Secondary | ICD-10-CM | POA: Diagnosis not present

## 2019-09-28 DIAGNOSIS — F17200 Nicotine dependence, unspecified, uncomplicated: Secondary | ICD-10-CM | POA: Diagnosis not present

## 2019-09-28 DIAGNOSIS — Z0000 Encounter for general adult medical examination without abnormal findings: Secondary | ICD-10-CM

## 2019-09-28 DIAGNOSIS — Z1231 Encounter for screening mammogram for malignant neoplasm of breast: Secondary | ICD-10-CM

## 2019-09-28 DIAGNOSIS — E785 Hyperlipidemia, unspecified: Secondary | ICD-10-CM | POA: Diagnosis not present

## 2019-09-28 MED ORDER — MEMANTINE HCL 5 MG PO TABS: 5.0000 mg | ORAL_TABLET | Freq: Two times a day (BID) | ORAL | 3 refills | Status: DC

## 2019-09-28 MED ORDER — BISOPROLOL-HYDROCHLOROTHIAZIDE 10-6.25 MG PO TABS: 1.0000 | ORAL_TABLET | Freq: Every day | ORAL | 1 refills | Status: DC

## 2019-09-28 MED ORDER — AMLODIPINE BESYLATE 5 MG PO TABS: 5.0000 mg | ORAL_TABLET | Freq: Every day | ORAL | 1 refills | Status: DC

## 2019-09-28 NOTE — Progress Notes (Signed)
Patient: Casey Lara, Female    DOB: [birth year], 69 y.o.   MRN: [id number] Visit Date: 09/28/2019  Today's Provider: Halina Maidens, MD  Pt is brought to clinic today by her son Ronalee Belts. Chief Complaint  Patient presents with  . Medicare Wellness   Subjective:    Annual wellness visit Alizia Jankovic Corney is a 69 y.o. female who presents today for her Subsequent Annual Wellness Visit. She feels well. She reports exercising walking several times a week. She reports she is sleeping well. She denies breast issues.  Mammogram  11/2017 DEXA  08/2008 Colonoscopy  05/2013 Immunization History  Administered Date(s) Administered  . Influenza, High Dose Seasonal PF 06/14/2018  . Influenza,inj,Quad PF,6+ Mos 06/19/2015, 06/19/2016, 08/19/2017  . Pneumococcal Conjugate-13 06/19/2016  . Pneumococcal Polysaccharide-23 05/29/2013, 08/22/2018  . Tdap 01/17/2014    ----------------------------------------------------------- Hypertension This is a chronic problem. The problem is controlled. Pertinent negatives include no chest pain, headaches, palpitations or shortness of breath. Past treatments include calcium channel blockers, beta blockers and diuretics. The current treatment provides significant improvement.  Hyperlipidemia This is a chronic problem. The problem is controlled. Pertinent negatives include no chest pain or shortness of breath. Current antihyperlipidemic treatment includes statins. The current treatment provides significant improvement of lipids. There are no compliance problems.    Lab Results  Component Value Date   CREATININE 0.85 12/23/2018   BUN 11 12/23/2018   NA 140 12/23/2018   K 4.2 12/23/2018   CL 98 12/23/2018   CO2 22 12/23/2018   Lab Results  Component Value Date   WBC 7.6 12/23/2018   HGB 16.1 (H) 12/23/2018   HCT 45.9 12/23/2018   MCV 86 12/23/2018   PLT 290 12/23/2018   Lab Results  Component Value Date   CHOL 141 12/23/2018   HDL 33 (L)  12/23/2018   LDLCALC 73 12/23/2018   TRIG 177 (H) 12/23/2018   CHOLHDL 4.3 12/23/2018   Lab Results  Component Value Date   TSH 1.620 12/23/2018   No results found for: HGBA1C   Review of Systems  Constitutional: Negative for appetite change, chills, fatigue, fever and unexpected weight change.  HENT: Negative for congestion, hearing loss, tinnitus, trouble swallowing and voice change.   Eyes: Negative for visual disturbance.  Respiratory: Positive for cough. Negative for chest tightness, shortness of breath and wheezing.   Cardiovascular: Negative for chest pain, palpitations and leg swelling.  Gastrointestinal: Negative for abdominal pain, constipation, diarrhea and vomiting.  Endocrine: Negative for polydipsia and polyuria.  Genitourinary: Negative for dysuria, frequency, genital sores, vaginal bleeding and vaginal discharge.  Musculoskeletal: Negative for arthralgias, gait problem and joint swelling.  Skin: Negative for color change and rash.  Allergic/Immunologic: Negative for environmental allergies.  Neurological: Negative for dizziness, tremors, light-headedness and headaches.  Hematological: Negative for adenopathy. Does not bruise/bleed easily.  Psychiatric/Behavioral: Negative for dysphoric mood and sleep disturbance. The patient is not nervous/anxious.     Social History   Socioeconomic History  . Marital status: Widowed    Spouse name: Not on file  . Number of children: Not on file  . Years of education: Not on file  . Highest education level: Not on file  Occupational History  . Not on file  Tobacco Use  . Smoking status: Current Every Day Smoker    Packs/day: 1.00    Years: 50.00    Pack years: 50.00    Types: Cigarettes    Start date: 08/03/1966  . Smokeless tobacco: Never Used  .  Tobacco comment: smoking since age 23  1 pack daily   Substance and Sexual Activity  . Alcohol use: No    Alcohol/week: 0.0 standard drinks  . Drug use: No  . Sexual  activity: Not on file  Other Topics Concern  . Not on file  Social History Narrative  . Not on file   Social Determinants of Health   Financial Resource Strain:   . Difficulty of Paying Living Expenses: Not on file  Food Insecurity:   . Worried About Charity fundraiser in the Last Year: Not on file  . Ran Out of Food in the Last Year: Not on file  Transportation Needs:   . Lack of Transportation (Medical): Not on file  . Lack of Transportation (Non-Medical): Not on file  Physical Activity:   . Days of Exercise per Week: Not on file  . Minutes of Exercise per Session: Not on file  Stress:   . Feeling of Stress : Not on file  Social Connections:   . Frequency of Communication with Friends and Family: Not on file  . Frequency of Social Gatherings with Friends and Family: Not on file  . Attends Religious Services: Not on file  . Active Member of Clubs or Organizations: Not on file  . Attends Archivist Meetings: Not on file  . Marital Status: Not on file  Intimate Partner Violence:   . Fear of Current or Ex-Partner: Not on file  . Emotionally Abused: Not on file  . Physically Abused: Not on file  . Sexually Abused: Not on file    Patient Active Problem List   Diagnosis Date Noted  . Memory difficulties 12/23/2018  . Tobacco dependence 06/19/2016  . Adenomatous colon polyp 06/19/2015  . Constipation 06/19/2015  . Bilateral low back pain with left-sided sciatica 06/19/2015  . H/O varicella 04/12/2015  . Obstructive apnea 02/11/2015  . History of malignant melanoma of skin 02/11/2015  . Dyslipidemia 02/11/2015  . Allergic state 02/11/2015  . Essential (primary) hypertension 02/11/2015  . Anxiety 02/11/2015  . Insomnia, persistent 02/11/2015  . CMC arthritis, thumb, degenerative 02/11/2015    Past Surgical History:  Procedure Laterality Date  . CARPAL TUNNEL RELEASE Left 2014  . COLONOSCOPY  05/2013   serrated adenoma  . LUMBAR DISC SURGERY    . MELANOMA  EXCISION  2015    Her family history includes Breast cancer in her cousin, maternal grandmother, and paternal grandmother; Breast cancer (age of onset: 52) in her mother and sister; Cancer in her father; Stroke in her mother.     Current Meds  Medication Sig  . albuterol (PROVENTIL HFA;VENTOLIN HFA) 108 (90 Base) MCG/ACT inhaler TAKE 2 PUFFS 4 TIMES A DAY  . amLODipine (NORVASC) 5 MG tablet Take 1 tablet (5 mg total) by mouth daily.  Marland Kitchen atorvastatin (LIPITOR) 20 MG tablet TAKE 1 TABLET (20 MG TOTAL) BY MOUTH DAILY AT 6 PM.  . bisoprolol-hydrochlorothiazide (ZIAC) 10-6.25 MG tablet Take 1 tablet by mouth daily.  . diazepam (VALIUM) 5 MG tablet TAKE 1 TABLET BY MOUTH TWICE A DAY  . fluticasone (FLONASE) 50 MCG/ACT nasal spray SPRAY 2 SPRAYS INTO EACH NOSTRIL EVERY DAY  . losartan (COZAAR) 25 MG tablet TAKE 2 TABLETS BY MOUTH DAILY (REPLACES 50MG  TABLET)    Patient Care Team: Glean Hess, MD as PCP - General (Family Medicine) Hessie Knows, MD as Consulting Physician (Orthopedic Surgery) Josefine Class, MD as Referring Physician (Gastroenterology) Jannet Mantis, MD (Dermatology)  Objective:   Wt Readings from Last 3 Encounters:  09/28/19 157 lb (71.2 kg)  12/23/18 142 lb (64.4 kg)  09/01/18 127 lb (57.6 kg)    Vitals: BP 124/78   Pulse 84   Temp (!) 97 F (36.1 C) (Temporal)   Ht 5\' 3"  (1.6 m)   Wt 157 lb (71.2 kg)   SpO2 92%   BMI 27.81 kg/m   Physical Exam Vitals and nursing note reviewed.  Constitutional:      General: She is not in acute distress.    Appearance: She is well-developed.  HENT:     Head: Normocephalic and atraumatic.     Right Ear: Tympanic membrane and ear canal normal.     Left Ear: Tympanic membrane and ear canal normal.     Nose:     Right Sinus: No maxillary sinus tenderness.     Left Sinus: No maxillary sinus tenderness.  Eyes:     General: No scleral icterus.       Right eye: No discharge.        Left eye: No  discharge.     Conjunctiva/sclera: Conjunctivae normal.  Neck:     Thyroid: No thyromegaly.     Vascular: No carotid bruit.  Cardiovascular:     Rate and Rhythm: Normal rate and regular rhythm.     Pulses: Normal pulses.     Heart sounds: Normal heart sounds.  Pulmonary:     Effort: Pulmonary effort is normal. No respiratory distress.     Breath sounds: No wheezing.  Chest:     Breasts:        Right: No mass, nipple discharge, skin change or tenderness.        Left: No mass, nipple discharge, skin change or tenderness.  Abdominal:     General: Bowel sounds are normal.     Palpations: Abdomen is soft.     Tenderness: There is no abdominal tenderness.  Musculoskeletal:        General: Normal range of motion.     Cervical back: Normal range of motion. No erythema.  Lymphadenopathy:     Cervical: No cervical adenopathy.  Skin:    General: Skin is warm and dry.     Findings: No rash.  Neurological:     Mental Status: She is alert and oriented to person, place, and time.     Cranial Nerves: No cranial nerve deficit.     Sensory: No sensory deficit.     Deep Tendon Reflexes: Reflexes are normal and symmetric.  Psychiatric:        Attention and Perception: Attention normal.        Mood and Affect: Mood normal.        Speech: Speech normal.        Behavior: Behavior normal. Behavior is cooperative.        Thought Content: Thought content normal.        Cognition and Memory: Memory is impaired.     Activities of Daily Living In your present state of health, do you have any difficulty performing the following activities: 09/28/2019  Hearing? N  Vision? N  Comment wears glasses for reading  Difficulty concentrating or making decisions? N  Walking or climbing stairs? N  Dressing or bathing? N  Doing errands, shopping? N  Some recent data might be hidden    Fall Risk Assessment Fall Risk  09/28/2019 12/23/2018 08/22/2018 08/19/2017 06/19/2016  Falls in the past year? 0 0 0 No  No   Number falls in past yr: 0 0 0 - -  Injury with Fall? 0 0 0 - -  Follow up Falls evaluation completed - Falls evaluation completed - -     Depression Screen PHQ 2/9 Scores 09/28/2019 12/23/2018 08/22/2018 08/19/2017  PHQ - 2 Score 0 0 0 0  PHQ- 9 Score 4 0 - -   GAD 7 : Generalized Anxiety Score 09/28/2019  Nervous, Anxious, on Edge 0  Control/stop worrying 0  Worry too much - different things 0  Trouble relaxing 0  Restless 0  Easily annoyed or irritable 0  Afraid - awful might happen 0  Total GAD 7 Score 0  Anxiety Difficulty Not difficult at all    6CIT Screen 09/28/2019 08/22/2018  What Year? 4 points 4 points  What month? 3 points 0 points  What time? 0 points 0 points  Count back from 20 4 points 0 points  Months in reverse 4 points 2 points  Repeat phrase 6 points 4 points  Total Score 21 10    Medicare Annual Wellness Visit Summary:  Reviewed patient's Family Medical History Reviewed and updated list of patient's medical providers Assessment of cognitive impairment was done Assessed patient's functional ability Established a written schedule for health screening [place] Completed and Reviewed  Exercise Activities and Dietary recommendations Goals   None     Immunization History  Administered Date(s) Administered  . Influenza, High Dose Seasonal PF 06/14/2018  . Influenza,inj,Quad PF,6+ Mos 06/19/2015, 06/19/2016, 08/19/2017  . Pneumococcal Conjugate-13 06/19/2016  . Pneumococcal Polysaccharide-23 05/29/2013, 08/22/2018  . Tdap 01/17/2014    Health Maintenance  Topic Date Due  . MAMMOGRAM  11/24/2018  . INFLUENZA VACCINE  03/04/2019  . COLONOSCOPY  05/24/2023  . TETANUS/TDAP  01/18/2024  . DEXA SCAN  Completed  . PNA vac Low Risk Adult  Completed  . Hepatitis C Screening  Addressed    Discussed health benefits of physical activity, and encouraged her to engage in regular exercise appropriate for her age and condition.      ------------------------------------------------------------------------------------------------------------  Assessment & Plan:   1. Medicare annual wellness visit, subsequent MAW measures satisfied today  2. Encounter for screening mammogram for breast cancer Pt/son will schedule annual screening - MM 3D SCREEN BREAST BILATERAL; Future  3. Essential (primary) hypertension Clinically stable exam with well controlled BP on amlodipine, bisoprosol and hctz. Tolerating medications without side effects at this time. Pt to continue current regimen and low sodium diet; benefits of regular exercise as able discussed. - CBC with Differential/Platelet - Comprehensive metabolic panel - TSH - POCT urinalysis dipstick - bisoprolol-hydrochlorothiazide (ZIAC) 10-6.25 MG tablet; Take 1 tablet by mouth daily.  Dispense: 90 tablet; Refill: 1 - amLODipine (NORVASC) 5 MG tablet; Take 1 tablet (5 mg total) by mouth daily.  Dispense: 90 tablet; Refill: 1  4. Dyslipidemia Tolerating statin medication without side effects at this time Continue same therapy without change at this time. - Lipid panel  5. Tobacco dependence Pt continues smoke She has had one LDCT screening  6. Memory difficulties Patient has good support with her son I discussed with the patient and then with her son  Will begin Namenda bid Organize medications in a pill box to avoid confusion with dosing - son will start supervising Consider Neurology consultation - memantine (NAMENDA) 5 MG tablet; Take 1 tablet (5 mg total) by mouth 2 (two) times daily.  Dispense: 60 tablet; Refill: 3   Partially dictated using  Editor, commissioning. Any errors are unintentional.  Halina Maidens, MD [facility]  09/28/2019

## 2019-09-29 ENCOUNTER — Ambulatory Visit: Payer: Medicare Other | Attending: Internal Medicine

## 2019-09-29 DIAGNOSIS — Z23 Encounter for immunization: Secondary | ICD-10-CM | POA: Insufficient documentation

## 2019-09-29 LAB — CBC WITH DIFFERENTIAL/PLATELET
Basophils Absolute: 0 10*3/uL (ref 0.0–0.2)
Basos: 0 %
EOS (ABSOLUTE): 0 10*3/uL (ref 0.0–0.4)
Eos: 1 %
Hematocrit: 49.1 % — ABNORMAL HIGH (ref 34.0–46.6)
Hemoglobin: 16.8 g/dL — ABNORMAL HIGH (ref 11.1–15.9)
Immature Grans (Abs): 0 10*3/uL (ref 0.0–0.1)
Immature Granulocytes: 0 %
Lymphocytes Absolute: 1.8 10*3/uL (ref 0.7–3.1)
Lymphs: 29 %
MCH: 29.6 pg (ref 26.6–33.0)
MCHC: 34.2 g/dL (ref 31.5–35.7)
MCV: 86 fL (ref 79–97)
Monocytes Absolute: 0.5 10*3/uL (ref 0.1–0.9)
Monocytes: 7 %
Neutrophils Absolute: 3.8 10*3/uL (ref 1.4–7.0)
Neutrophils: 63 %
Platelets: 240 10*3/uL (ref 150–450)
RBC: 5.68 x10E6/uL — ABNORMAL HIGH (ref 3.77–5.28)
RDW: 12.8 % (ref 11.7–15.4)
WBC: 6.2 10*3/uL (ref 3.4–10.8)

## 2019-09-29 LAB — COMPREHENSIVE METABOLIC PANEL
ALT: 52 IU/L — ABNORMAL HIGH (ref 0–32)
AST: 36 IU/L (ref 0–40)
Albumin/Globulin Ratio: 1.4 (ref 1.2–2.2)
Albumin: 4.2 g/dL (ref 3.8–4.8)
Alkaline Phosphatase: 96 IU/L (ref 39–117)
BUN/Creatinine Ratio: 9 — ABNORMAL LOW (ref 12–28)
BUN: 7 mg/dL — ABNORMAL LOW (ref 8–27)
Bilirubin Total: 0.3 mg/dL (ref 0.0–1.2)
CO2: 22 mmol/L (ref 20–29)
Calcium: 9.1 mg/dL (ref 8.7–10.3)
Chloride: 104 mmol/L (ref 96–106)
Creatinine, Ser: 0.77 mg/dL (ref 0.57–1.00)
GFR calc Af Amer: 92 mL/min/{1.73_m2} (ref >59)
GFR calc non Af Amer: 80 mL/min/{1.73_m2} (ref >59)
Globulin, Total: 3 g/dL (ref 1.5–4.5)
Glucose: 101 mg/dL — ABNORMAL HIGH (ref 65–99)
Potassium: 3.6 mmol/L (ref 3.5–5.2)
Sodium: 144 mmol/L (ref 134–144)
Total Protein: 7.2 g/dL (ref 6.0–8.5)

## 2019-09-29 LAB — LIPID PANEL
Chol/HDL Ratio: 4.9 ratio — ABNORMAL HIGH (ref 0.0–4.4)
Cholesterol, Total: 158 mg/dL (ref 100–199)
HDL: 32 mg/dL — ABNORMAL LOW (ref >39)
LDL Chol Calc (NIH): 93 mg/dL (ref 0–99)
Triglycerides: 188 mg/dL — ABNORMAL HIGH (ref 0–149)
VLDL Cholesterol Cal: 33 mg/dL (ref 5–40)

## 2019-09-29 LAB — TSH: TSH: 1.65 u[IU]/mL (ref 0.450–4.500)

## 2019-09-29 NOTE — Progress Notes (Signed)
   Covid-19 Vaccination Clinic  Name:  Casey Lara    MRN: [id number] DOB: August 20, 1950  09/29/2019  [name] was observed post Covid-19 immunization for 15 minutes without incidence. She was provided with Vaccine Information Sheet and instruction to access the V-Safe system.   [name] was instructed to call 911 with any severe reactions post vaccine: Marland Kitchen Difficulty breathing  . Swelling of your face and throat  . A fast heartbeat  . A bad rash all over your body  . Dizziness and weakness    Immunizations Administered    Name Date Dose VIS Date Route   Pfizer COVID-19 Vaccine 09/29/2019 12:30 PM 0.3 mL 07/14/2019 Intramuscular   Manufacturer: [place]   Lot: [id number]   [place]: [id number]

## 2019-10-20 ENCOUNTER — Other Ambulatory Visit: Payer: Self-pay | Admitting: Internal Medicine

## 2019-10-20 DIAGNOSIS — R413 Other amnesia: Secondary | ICD-10-CM

## 2019-10-22 ENCOUNTER — Other Ambulatory Visit: Payer: Self-pay | Admitting: Internal Medicine

## 2019-10-22 DIAGNOSIS — G4700 Insomnia, unspecified: Secondary | ICD-10-CM

## 2019-10-24 ENCOUNTER — Telehealth: Payer: Self-pay | Admitting: *Deleted

## 2019-10-24 NOTE — Telephone Encounter (Signed)
(  10/24/19) Left message for pt to notify them that it is time to schedule annual low dose lung cancer screening CT scan. Instructed patient to call back to verify information prior to the scan being scheduled °SRW °  ° ° ° °

## 2019-10-31 ENCOUNTER — Ambulatory Visit: Payer: Medicare Other | Attending: Internal Medicine

## 2019-10-31 DIAGNOSIS — Z23 Encounter for immunization: Secondary | ICD-10-CM

## 2019-10-31 NOTE — Progress Notes (Signed)
   Covid-19 Vaccination Clinic  Name:  Carlen Klausen    MRN: [id number] DOB: 07-05-1951  10/31/2019  Ms. Molyneaux was observed post Covid-19 immunization for 15 minutes without incident. She was provided with Vaccine Information Sheet and instruction to access the V-Safe system.   Ms. Lesieur was instructed to call 911 with any severe reactions post vaccine: Marland Kitchen Difficulty breathing  . Swelling of face and throat  . A fast heartbeat  . A bad rash all over body  . Dizziness and weakness   Immunizations Administered    Name Date Dose VIS Date Route   Pfizer COVID-19 Vaccine 10/31/2019  1:01 PM 0.3 mL 07/14/2019 Intramuscular   Manufacturer: [place]   Lot: [id number]   [place]: [id number]

## 2020-01-12 ENCOUNTER — Encounter: Payer: Self-pay | Admitting: Internal Medicine

## 2020-01-12 ENCOUNTER — Ambulatory Visit (INDEPENDENT_AMBULATORY_CARE_PROVIDER_SITE_OTHER): Payer: Medicare Other | Admitting: Internal Medicine

## 2020-01-12 ENCOUNTER — Other Ambulatory Visit: Payer: Self-pay

## 2020-01-12 VITALS — BP 138/80 | HR 66 | Ht 62.0 in | Wt 163.0 lb

## 2020-01-12 DIAGNOSIS — R413 Other amnesia: Secondary | ICD-10-CM | POA: Diagnosis not present

## 2020-01-12 DIAGNOSIS — I10 Essential (primary) hypertension: Secondary | ICD-10-CM | POA: Diagnosis not present

## 2020-01-12 MED ORDER — DONEPEZIL HCL 10 MG PO TABS: 10.0000 mg | ORAL_TABLET | Freq: Every day | ORAL | 1 refills | Status: DC

## 2020-01-12 NOTE — Patient Instructions (Addendum)
Please schedule a mammogram.  Number on card.  Let me know if you want to see Neurology.  The Namenda seems to be helping slightly. I want to add a second medication to help with memory.  aricept 10 mg once a day.

## 2020-01-12 NOTE — Progress Notes (Signed)
Date:  01/12/2020   Name:  Mickie Badders   DOB:  04-Sep-1950   MRN:  [id number]   Chief Complaint: Hypertension (Follow up.) and Memory Loss (6CIT- 17. )  Hypertension This is a chronic problem. The problem is controlled. Pertinent negatives include no blurred vision, chest pain, headaches, palpitations or shortness of breath. Past treatments include angiotensin blockers, beta blockers, calcium channel blockers and diuretics. The current treatment provides significant improvement.  Memory loss - pt is currently living with her son who is her caretaker.  He is now putting all her medications in a pill box each week for compliance.  Last visit we started Namenda which she has tolerated well. Neurology evaluation was discussed but deferred. Metabolic workup was negative. CT chest was negative.  She has not had a CT brain.  Lab Results  Component Value Date   CREATININE 0.77 09/28/2019   BUN 7 (L) 09/28/2019   NA 144 09/28/2019   K 3.6 09/28/2019   CL 104 09/28/2019   CO2 22 09/28/2019   Lab Results  Component Value Date   CHOL 158 09/28/2019   HDL 32 (L) 09/28/2019   LDLCALC 93 09/28/2019   TRIG 188 (H) 09/28/2019   CHOLHDL 4.9 (H) 09/28/2019   Lab Results  Component Value Date   TSH 1.650 09/28/2019   No results found for: HGBA1C Lab Results  Component Value Date   WBC 6.2 09/28/2019   HGB 16.8 (H) 09/28/2019   HCT 49.1 (H) 09/28/2019   MCV 86 09/28/2019   PLT 240 09/28/2019   Lab Results  Component Value Date   ALT 52 (H) 09/28/2019   AST 36 09/28/2019   ALKPHOS 96 09/28/2019   BILITOT 0.3 09/28/2019     Review of Systems  Constitutional: Negative for chills, fatigue, fever and unexpected weight change.  HENT: Negative for trouble swallowing.   Eyes: Negative for blurred vision.  Respiratory: Negative for chest tightness and shortness of breath.   Cardiovascular: Negative for chest pain, palpitations and leg swelling.  Gastrointestinal: Negative for  abdominal pain, constipation, diarrhea and nausea.  Genitourinary: Negative for dysuria.  Musculoskeletal: Positive for back pain. Negative for arthralgias and gait problem.  Neurological: Negative for dizziness and headaches.  Psychiatric/Behavioral: Positive for decreased concentration and sleep disturbance. The patient is not nervous/anxious.     Patient Active Problem List   Diagnosis Date Noted  . Memory difficulties 12/23/2018  . Tobacco dependence 06/19/2016  . Adenomatous colon polyp 06/19/2015  . Constipation 06/19/2015  . Bilateral low back pain with left-sided sciatica 06/19/2015  . H/O varicella 04/12/2015  . Obstructive apnea 02/11/2015  . History of malignant melanoma of skin 02/11/2015  . Dyslipidemia 02/11/2015  . Allergic state 02/11/2015  . Essential (primary) hypertension 02/11/2015  . Anxiety 02/11/2015  . Insomnia, persistent 02/11/2015  . CMC arthritis, thumb, degenerative 02/11/2015    No Known Allergies  Past Surgical History:  Procedure Laterality Date  . CARPAL TUNNEL RELEASE Left 2014  . COLONOSCOPY  05/2013   serrated adenoma  . LUMBAR DISC SURGERY    . MELANOMA EXCISION  2015    Social History   Tobacco Use  . Smoking status: Current Every Day Smoker    Packs/day: 1.00    Years: 50.00    Pack years: 50.00    Types: Cigarettes    Start date: 08/03/1966  . Smokeless tobacco: Never Used  . Tobacco comment: smoking since age 7  1 pack daily   91  Use  . Vaping Use: Never used  Substance Use Topics  . Alcohol use: No    Alcohol/week: 0.0 standard drinks  . Drug use: No     Medication list has been reviewed and updated.  Current Meds  Medication Sig  . albuterol (PROVENTIL HFA;VENTOLIN HFA) 108 (90 Base) MCG/ACT inhaler TAKE 2 PUFFS 4 TIMES A DAY  . amLODipine (NORVASC) 5 MG tablet Take 1 tablet (5 mg total) by mouth daily.  Marland Kitchen atorvastatin (LIPITOR) 20 MG tablet TAKE 1 TABLET (20 MG TOTAL) BY MOUTH DAILY AT 6 PM.  .  bisoprolol-hydrochlorothiazide (ZIAC) 10-6.25 MG tablet Take 1 tablet by mouth daily.  . diazepam (VALIUM) 5 MG tablet TAKE 1 TABLET BY MOUTH TWICE A DAY  . fluticasone (FLONASE) 50 MCG/ACT nasal spray SPRAY 2 SPRAYS INTO EACH NOSTRIL EVERY DAY  . losartan (COZAAR) 25 MG tablet TAKE 2 TABLETS BY MOUTH DAILY (REPLACES 50MG  TABLET)  . memantine (NAMENDA) 5 MG tablet TAKE 1 TABLET BY MOUTH TWICE A DAY    PHQ 2/9 Scores 01/12/2020 09/28/2019 12/23/2018 08/22/2018  PHQ - 2 Score 0 0 0 0  PHQ- 9 Score 3 4 0 -   6CIT Screen 01/12/2020 09/28/2019 08/22/2018  What Year? 4 points 4 points 4 points  What month? 0 points 3 points 0 points  What time? 3 points 0 points 0 points  Count back from 20 4 points 4 points 0 points  Months in reverse 4 points 4 points 2 points  Repeat phrase 2 points 6 points 4 points  Total Score 17 21 10     BP Readings from Last 3 Encounters:  01/12/20 138/80  09/28/19 124/78  12/23/18 112/68    Physical Exam Vitals and nursing note reviewed.  Constitutional:      General: She is not in acute distress.    Appearance: Normal appearance. She is well-developed.  HENT:     Head: Normocephalic and atraumatic.  Neck:     Vascular: No carotid bruit.  Cardiovascular:     Rate and Rhythm: Normal rate and regular rhythm.     Pulses: Normal pulses.  Pulmonary:     Effort: Pulmonary effort is normal. No respiratory distress.     Breath sounds: No wheezing or rhonchi.  Musculoskeletal:     Cervical back: Normal range of motion.     Right lower leg: No edema.     Left lower leg: No edema.  Lymphadenopathy:     Cervical: No cervical adenopathy.  Skin:    General: Skin is warm and dry.     Findings: No rash.  Neurological:     Mental Status: She is alert and oriented to person, place, and time.  Psychiatric:        Attention and Perception: Attention normal.        Mood and Affect: Mood normal.        Speech: Speech normal.        Behavior: Behavior normal.         Cognition and Memory: Memory is impaired.     Wt Readings from Last 3 Encounters:  01/12/20 163 lb (73.9 kg)  09/28/19 157 lb (71.2 kg)  12/23/18 142 lb (64.4 kg)    BP 138/80 (BP Location: Left Arm, Patient Position: Sitting, Cuff Size: Normal)   Pulse 66   Ht 5\' 2"  (1.575 m)   Wt 163 lb (73.9 kg)   SpO2 93%   BMI 29.81 kg/m   Assessment and Plan: 1. Memory  difficulties May have improved some with Namenda - she seems to be tolerating it well Will add Aricept She has a supportive safe environment with her son who is currently not working Would have discussed with him but he waited in the car.  AVS is given and highlighted for patient to share - donepezil (ARICEPT) 10 MG tablet; Take 1 tablet (10 mg total) by mouth at bedtime.  Dispense: 90 tablet; Refill: 1  2. Essential (primary) hypertension Clinically stable exam with well controlled BP on 4 agents. Tolerating medications without side effects at this time. Pt to continue current regimen and low sodium diet; benefits of regular exercise as able discussed.   Partially dictated using Editor, commissioning. Any errors are unintentional.  Halina Maidens, MD [facility]  01/12/2020

## 2020-01-24 ENCOUNTER — Encounter: Payer: Self-pay | Admitting: *Deleted

## 2020-02-23 ENCOUNTER — Other Ambulatory Visit: Payer: Self-pay | Admitting: Internal Medicine

## 2020-02-23 DIAGNOSIS — G4700 Insomnia, unspecified: Secondary | ICD-10-CM

## 2020-02-23 NOTE — Telephone Encounter (Signed)
Requested medication (s) are due for refill today:  Yes  Requested medication (s) are on the active medication list:  Yes  Future visit scheduled:  Yes  Last Refill: 10/23/19; # 60; RF x 3  Notes to clinic:  Failed protocol due to UDS outdated.  Medication not delegated.   Requested Prescriptions  Pending Prescriptions Disp Refills   diazepam (VALIUM) 5 MG tablet [Pharmacy Med Name: DIAZEPAM 5 MG TABLET] 60 tablet     Sig: TAKE 1 TABLET BY MOUTH TWICE A DAY      Not Delegated - Psychiatry:  Anxiolytics/Hypnotics Failed - 02/23/2020  1:51 PM      Failed - This refill cannot be delegated      Failed - Urine Drug Screen completed in last 360 days.      Passed - Valid encounter within last 6 months    Recent Outpatient Visits           1 month ago Memory difficulties   [facility] Glean Hess, MD   4 months ago Medicare annual wellness visit, subsequent   [facility] Glean Hess, MD   1 year ago Essential (primary) hypertension   [facility] Glean Hess, MD   1 year ago Essential (primary) hypertension   [facility] Glean Hess, MD   1 year ago Eustachian tube dysfunction, bilateral   [facility] Glean Hess, MD       Future Appointments             In 2 months [name], MD [facility], [place]   In 7 months Army Melia, Jesse Sans, MD [facility], [facility]

## 2020-02-23 NOTE — Telephone Encounter (Signed)
Please Advise. Last office visit 01/12/2020.  KP

## 2020-03-24 ENCOUNTER — Other Ambulatory Visit: Payer: Self-pay | Admitting: Internal Medicine

## 2020-03-24 DIAGNOSIS — G4700 Insomnia, unspecified: Secondary | ICD-10-CM

## 2020-03-24 DIAGNOSIS — I10 Essential (primary) hypertension: Secondary | ICD-10-CM

## 2020-03-24 NOTE — Telephone Encounter (Signed)
Requested Prescriptions  Pending Prescriptions Disp Refills  . bisoprolol-hydrochlorothiazide (ZIAC) 10-6.25 MG tablet [Pharmacy Med Name: BISOPROLOL-HCTZ 10-6.25 MG TAB] 90 tablet 1    Sig: TAKE 1 TABLET BY MOUTH EVERY DAY     Cardiovascular: Beta Blocker + Diuretic Combos Passed - 03/24/2020  3:48 PM      Passed - K in normal range and within 180 days    Potassium  Date Value Ref Range Status  09/28/2019 3.6 3.5 - 5.2 mmol/L Final         Passed - Na in normal range and within 180 days    Sodium  Date Value Ref Range Status  09/28/2019 144 134 - 144 mmol/L Final         Passed - Cr in normal range and within 180 days    Creatinine, Ser  Date Value Ref Range Status  09/28/2019 0.77 0.57 - 1.00 mg/dL Final         Passed - Ca in normal range and within 180 days    Calcium  Date Value Ref Range Status  09/28/2019 9.1 8.7 - 10.3 mg/dL Final         Passed - Patient is not pregnant      Passed - Last BP in normal range    BP Readings from Last 1 Encounters:  01/12/20 138/80         Passed - Last Heart Rate in normal range    Pulse Readings from Last 1 Encounters:  01/12/20 66         Passed - Valid encounter within last 6 months    Recent Outpatient Visits          2 months ago Memory difficulties   [facility] Glean Hess, MD   5 months ago Medicare annual wellness visit, subsequent   [facility] Glean Hess, MD   1 year ago Essential (primary) hypertension   [facility] Glean Hess, MD   1 year ago Essential (primary) hypertension   [facility] Glean Hess, MD   1 year ago Eustachian tube dysfunction, bilateral   [facility] Glean Hess, MD      Future Appointments            In 1 month [name], MD [facility], [place]   In 6 months [name], MD [facility], [facility]           . diazepam (VALIUM) 5 MG tablet [Pharmacy Med Name: DIAZEPAM 5 MG  TABLET] 60 tablet     Sig: TAKE 1 TABLET BY MOUTH TWICE A DAY     Not Delegated - Psychiatry:  Anxiolytics/Hypnotics Failed - 03/24/2020  3:48 PM      Failed - This refill cannot be delegated      Failed - Urine Drug Screen completed in last 360 days.      Passed - Valid encounter within last 6 months    Recent Outpatient Visits          2 months ago Memory difficulties   [facility] Glean Hess, MD   5 months ago Medicare annual wellness visit, subsequent   [facility] Glean Hess, MD   1 year ago Essential (primary) hypertension   [facility] Glean Hess, MD   1 year ago Essential (primary) hypertension   [facility] Glean Hess, MD   1 year ago Eustachian  tube dysfunction, bilateral   [facility] Glean Hess, MD      Future Appointments            In 1 month [name], MD [facility], [place]   In 6 months [name], MD [facility], [facility]

## 2020-03-24 NOTE — Telephone Encounter (Signed)
Requested medication (s) are due for refill today: yes  Requested medication (s) are on the active medication list: yes  Last refill:  02/23/20  Future visit scheduled: yes  Notes to clinic:  med not delegated to NT to RF   Requested Prescriptions  Pending Prescriptions Disp Refills   diazepam (VALIUM) 5 MG tablet [Pharmacy Med Name: DIAZEPAM 5 MG TABLET] 60 tablet     Sig: TAKE 1 TABLET BY MOUTH TWICE A DAY      Not Delegated - Psychiatry:  Anxiolytics/Hypnotics Failed - 03/24/2020  3:48 PM      Failed - This refill cannot be delegated      Failed - Urine Drug Screen completed in last 360 days.      Passed - Valid encounter within last 6 months    Recent Outpatient Visits           2 months ago Memory difficulties   [facility] Glean Hess, MD   5 months ago Medicare annual wellness visit, subsequent   [facility] Glean Hess, MD   1 year ago Essential (primary) hypertension   [facility] Glean Hess, MD   1 year ago Essential (primary) hypertension   [facility] Glean Hess, MD   1 year ago Eustachian tube dysfunction, bilateral   [facility] Glean Hess, MD       Future Appointments             In 1 month [name], MD [facility], [place]   In 6 months Glean Hess, MD [facility], [facility]             Signed Prescriptions Disp Refills   bisoprolol-hydrochlorothiazide (ZIAC) 10-6.25 MG tablet 90 tablet 1    Sig: TAKE 1 TABLET BY MOUTH EVERY DAY      Cardiovascular: Beta Blocker + Diuretic Combos Passed - 03/24/2020  3:48 PM      Passed - K in normal range and within 180 days    Potassium  Date Value Ref Range Status  09/28/2019 3.6 3.5 - 5.2 mmol/L Final          Passed - Na in normal range and within 180 days    Sodium  Date Value Ref Range Status  09/28/2019 144 134 - 144 mmol/L Final          Passed - Cr in normal range and within 180 days     Creatinine, Ser  Date Value Ref Range Status  09/28/2019 0.77 0.57 - 1.00 mg/dL Final          Passed - Ca in normal range and within 180 days    Calcium  Date Value Ref Range Status  09/28/2019 9.1 8.7 - 10.3 mg/dL Final          Passed - Patient is not pregnant      Passed - Last BP in normal range    BP Readings from Last 1 Encounters:  01/12/20 138/80          Passed - Last Heart Rate in normal range    Pulse Readings from Last 1 Encounters:  01/12/20 66          Passed - Valid encounter within last 6 months    Recent Outpatient Visits           2 months ago Memory difficulties   [facility] Glean Hess, MD   5  months ago Medicare annual wellness visit, subsequent   [facility] Glean Hess, MD   1 year ago Essential (primary) hypertension   [facility] Glean Hess, MD   1 year ago Essential (primary) hypertension   [facility] Glean Hess, MD   1 year ago Eustachian tube dysfunction, bilateral   [facility] Glean Hess, MD       Future Appointments             In 1 month [name], MD [facility], [place]   In 6 months [name], MD [facility], [facility]

## 2020-03-25 NOTE — Telephone Encounter (Signed)
Please Advise. Last office visit 01/12/2020.  KP

## 2020-04-21 ENCOUNTER — Other Ambulatory Visit: Payer: Self-pay | Admitting: Internal Medicine

## 2020-04-21 DIAGNOSIS — I10 Essential (primary) hypertension: Secondary | ICD-10-CM

## 2020-04-21 NOTE — Telephone Encounter (Signed)
Requested Prescriptions  Pending Prescriptions Disp Refills   losartan (COZAAR) 25 MG tablet [Pharmacy Med Name: LOSARTAN POTASSIUM 25 MG TAB] 180 tablet 1    Sig: TAKE 2 TABLETS BY MOUTH DAILY (REPLACES 50MG  TABLET)     Cardiovascular:  Angiotensin Receptor Blockers Failed - 04/21/2020  3:02 PM      Failed - Cr in normal range and within 180 days    Creatinine, Ser  Date Value Ref Range Status  09/28/2019 0.77 0.57 - 1.00 mg/dL Final         Failed - K in normal range and within 180 days    Potassium  Date Value Ref Range Status  09/28/2019 3.6 3.5 - 5.2 mmol/L Final         Passed - Patient is not pregnant      Passed - Last BP in normal range    BP Readings from Last 1 Encounters:  01/12/20 138/80         Passed - Valid encounter within last 6 months    Recent Outpatient Visits          3 months ago Memory difficulties   [facility] Glean Hess, MD   6 months ago Medicare annual wellness visit, subsequent   [facility] Glean Hess, MD   1 year ago Essential (primary) hypertension   [facility] Glean Hess, MD   1 year ago Essential (primary) hypertension   [facility] Glean Hess, MD   1 year ago Eustachian tube dysfunction, bilateral   [facility] Glean Hess, MD      Future Appointments            In 3 weeks [name], MD [facility], [place]   In 5 months [name], MD [facility]            amLODipine ([place]) 5 MG tablet [Pharmacy Med Name: AMLODIPINE BESYLATE 5 MG TAB] 90 tablet 1    Sig: TAKE 1 TABLET BY MOUTH EVERY DAY     Cardiovascular:  Calcium Channel Blockers Passed - 04/21/2020  3:02 PM      Passed - Last BP in normal range    BP Readings from Last 1 Encounters:  01/12/20 138/80         Passed - Valid encounter within last 6 months    Recent Outpatient Visits          3 months ago Memory difficulties   [facility] Glean Hess, MD   6 months ago Medicare annual wellness visit, subsequent   [facility] Glean Hess, MD   1 year ago Essential (primary) hypertension   [facility] Glean Hess, MD   1 year ago Essential (primary) hypertension   [facility] Glean Hess, MD   1 year ago Eustachian tube dysfunction, bilateral   [facility] Glean Hess, MD      Future Appointments            In 3 weeks [name], MD [facility], [place]   In 5 months Army Melia, Jesse Sans, MD [facility], [facility]

## 2020-05-13 ENCOUNTER — Encounter: Payer: Self-pay | Admitting: Internal Medicine

## 2020-05-13 ENCOUNTER — Ambulatory Visit (INDEPENDENT_AMBULATORY_CARE_PROVIDER_SITE_OTHER): Payer: Medicare Other | Admitting: Internal Medicine

## 2020-05-13 ENCOUNTER — Other Ambulatory Visit: Payer: Self-pay

## 2020-05-13 VITALS — BP 134/78 | HR 55 | Temp 98.7°F | Ht 63.0 in | Wt 160.0 lb

## 2020-05-13 DIAGNOSIS — I10 Essential (primary) hypertension: Secondary | ICD-10-CM | POA: Diagnosis not present

## 2020-05-13 DIAGNOSIS — Z23 Encounter for immunization: Secondary | ICD-10-CM | POA: Diagnosis not present

## 2020-05-13 DIAGNOSIS — R413 Other amnesia: Secondary | ICD-10-CM

## 2020-05-13 NOTE — Patient Instructions (Signed)
Schedule your mammogram - number on card  Continue the same medications  No blood work is needed today  Follow up in 4 months

## 2020-05-13 NOTE — Progress Notes (Signed)
Date:  05/13/2020   Name:  Casey Lara   DOB:  [birth year]   MRN:  [id number]   Chief Complaint: Hypertension (Follow up. ) and Memory Loss (6CIT completed today.)  Hypertension This is a chronic problem. The problem is controlled. Associated symptoms include shortness of breath. Pertinent negatives include no chest pain or headaches. Past treatments include diuretics, ACE inhibitors and calcium channel blockers. The current treatment provides significant improvement.   Memory loss - pt had declined neurology referral.  CT chest and metabolic testing were negative.  She was started on Namenda and then Aricept was added last visit.  She says that she is feeling well.  She has no concerns today.  Lab Results  Component Value Date   CREATININE 0.77 09/28/2019   BUN 7 (L) 09/28/2019   NA 144 09/28/2019   K 3.6 09/28/2019   CL 104 09/28/2019   CO2 22 09/28/2019   Lab Results  Component Value Date   CHOL 158 09/28/2019   HDL 32 (L) 09/28/2019   LDLCALC 93 09/28/2019   TRIG 188 (H) 09/28/2019   CHOLHDL 4.9 (H) 09/28/2019   Lab Results  Component Value Date   TSH 1.650 09/28/2019   No results found for: HGBA1C Lab Results  Component Value Date   WBC 6.2 09/28/2019   HGB 16.8 (H) 09/28/2019   HCT 49.1 (H) 09/28/2019   MCV 86 09/28/2019   PLT 240 09/28/2019   Lab Results  Component Value Date   ALT 52 (H) 09/28/2019   AST 36 09/28/2019   ALKPHOS 96 09/28/2019   BILITOT 0.3 09/28/2019     Review of Systems  Constitutional: Negative for chills, fatigue, fever and unexpected weight change.  Respiratory: Positive for shortness of breath and wheezing. Negative for cough and chest tightness.   Cardiovascular: Negative for chest pain and leg swelling.  Musculoskeletal: Negative for arthralgias.  Neurological: Negative for dizziness, light-headedness and headaches.  Psychiatric/Behavioral: Positive for confusion and decreased concentration. Negative for sleep  disturbance. The patient is not nervous/anxious.     Patient Active Problem List   Diagnosis Date Noted   Memory difficulties 12/23/2018   Tobacco dependence 06/19/2016   Adenomatous colon polyp 06/19/2015   Constipation 06/19/2015   Bilateral low back pain with left-sided sciatica 06/19/2015   H/O varicella 04/12/2015   Obstructive apnea 02/11/2015   History of malignant melanoma of skin 02/11/2015   Dyslipidemia 02/11/2015   Allergic state 02/11/2015   Essential (primary) hypertension 02/11/2015   Anxiety 02/11/2015   Insomnia, persistent 02/11/2015   CMC arthritis, thumb, degenerative 02/11/2015    No Known Allergies  Past Surgical History:  Procedure Laterality Date   CARPAL TUNNEL RELEASE Left 2014   COLONOSCOPY  05/2013   serrated adenoma   LUMBAR DISC SURGERY     MELANOMA EXCISION  2015    Social History   Tobacco Use   Smoking status: Current Every Day Smoker    Packs/day: 1.00    Years: 50.00    Pack years: 50.00    Types: Cigarettes    Start date: 08/03/1966   Smokeless tobacco: Never Used   Tobacco comment: smoking since age 43  1 pack daily   Vaping Use   Vaping Use: Never used  Substance Use Topics   Alcohol use: No    Alcohol/week: 0.0 standard drinks   Drug use: No     Medication list has been reviewed and updated.  Current Meds  Medication Sig  albuterol (PROVENTIL HFA;VENTOLIN HFA) 108 (90 Base) MCG/ACT inhaler TAKE 2 PUFFS 4 TIMES A DAY   amLODipine (NORVASC) 5 MG tablet TAKE 1 TABLET BY MOUTH EVERY DAY   atorvastatin (LIPITOR) 20 MG tablet TAKE 1 TABLET (20 MG TOTAL) BY MOUTH DAILY AT 6 PM.   bisoprolol-hydrochlorothiazide (ZIAC) 10-6.25 MG tablet TAKE 1 TABLET BY MOUTH EVERY DAY   diazepam (VALIUM) 5 MG tablet TAKE 1 TABLET BY MOUTH TWICE A DAY   donepezil (ARICEPT) 10 MG tablet Take 1 tablet (10 mg total) by mouth at bedtime.   fluticasone (FLONASE) 50 MCG/ACT nasal spray SPRAY 2 SPRAYS INTO EACH NOSTRIL  EVERY DAY   losartan (COZAAR) 25 MG tablet TAKE 2 TABLETS BY MOUTH DAILY (REPLACES 50MG  TABLET)   memantine (NAMENDA) 5 MG tablet TAKE 1 TABLET BY MOUTH TWICE A DAY    PHQ 2/9 Scores 05/13/2020 01/12/2020 09/28/2019 12/23/2018  PHQ - 2 Score 0 0 0 0  PHQ- 9 Score 0 3 4 0    GAD 7 : Generalized Anxiety Score 05/13/2020 01/12/2020 09/28/2019  Nervous, Anxious, on Edge 0 0 0  Control/stop worrying 0 0 0  Worry too much - different things 0 0 0  Trouble relaxing 0 0 0  Restless 0 0 0  Easily annoyed or irritable 0 0 0  Afraid - awful might happen 0 0 0  Total GAD 7 Score 0 0 0  Anxiety Difficulty Not difficult at all Not difficult at all Not difficult at all   6CIT Screen 05/13/2020 01/12/2020 09/28/2019 08/22/2018  What Year? 4 points 4 points 4 points 4 points  What month? 3 points 0 points 3 points 0 points  What time? 0 points 3 points 0 points 0 points  Count back from 20 0 points 4 points 4 points 0 points  Months in reverse 4 points 4 points 4 points 2 points  Repeat phrase 6 points 2 points 6 points 4 points  Total Score 17 17 21 10       BP Readings from Last 3 Encounters:  05/13/20 134/78  01/12/20 138/80  09/28/19 124/78    Physical Exam Vitals and nursing note reviewed.  Constitutional:      General: She is not in acute distress.    Appearance: Normal appearance. She is well-developed.  HENT:     Head: Normocephalic and atraumatic.  Cardiovascular:     Rate and Rhythm: Normal rate and regular rhythm.     Pulses: Normal pulses.     Heart sounds: No murmur heard.   Pulmonary:     Effort: Pulmonary effort is normal. No respiratory distress.     Breath sounds: Wheezing present. No rhonchi.  Musculoskeletal:        General: Normal range of motion.     Cervical back: Normal range of motion.     Right lower leg: No edema.     Left lower leg: No edema.  Lymphadenopathy:     Cervical: No cervical adenopathy.  Skin:    General: Skin is warm and dry.     Capillary  Refill: Capillary refill takes less than 2 seconds.     Findings: No rash.  Neurological:     Mental Status: She is alert and oriented to person, place, and time.  Psychiatric:        Mood and Affect: Mood normal.        Cognition and Memory: Memory is impaired.     Wt Readings from Last 3 Encounters:  05/13/20  160 lb (72.6 kg)  01/12/20 163 lb (73.9 kg)  09/28/19 157 lb (71.2 kg)    BP 134/78    Pulse (!) 55    Temp 98.7 F (37.1 C) (Oral)    Ht 5\' 3"  (1.6 m)    Wt 160 lb (72.6 kg)    SpO2 95%    BMI 28.34 kg/m   Assessment and Plan: 1. Essential (primary) hypertension Clinically stable exam with well controlled BP on current medications. Tolerating medications without side effects at this time. Pt to continue current regimen and low sodium diet; benefits of regular exercise as able discussed.  2. Memory difficulties Appears stable; tolerating medications well Not driving - lives with son who manages her medications   Partially dictated using Editor, commissioning. Any errors are unintentional.  Halina Maidens, MD [facility]  05/13/2020

## 2020-07-04 ENCOUNTER — Other Ambulatory Visit: Payer: Self-pay | Admitting: Internal Medicine

## 2020-07-04 DIAGNOSIS — G4700 Insomnia, unspecified: Secondary | ICD-10-CM

## 2020-07-04 NOTE — Telephone Encounter (Signed)
Requested medication (s) are due for refill today: Yes  Requested medication (s) are on the active medication list: Yes  Last refill:  03/25/20  Future visit scheduled: Yes  Notes to clinic:  Unable to refill per protocol, cannot delegate     Requested Prescriptions  Pending Prescriptions Disp Refills   diazepam (VALIUM) 5 MG tablet [Pharmacy Med Name: DIAZEPAM 5 MG TABLET] 60 tablet 2    Sig: TAKE 1 TABLET BY MOUTH TWICE A DAY      Not Delegated - Psychiatry:  Anxiolytics/Hypnotics Failed - 07/04/2020 10:38 AM      Failed - This refill cannot be delegated      Failed - Urine Drug Screen completed in last 360 days      Passed - Valid encounter within last 6 months    Recent Outpatient Visits           1 month ago Essential (primary) hypertension   [facility] Glean Hess, MD   5 months ago Memory difficulties   [facility] Glean Hess, MD   9 months ago Medicare annual wellness visit, subsequent   [facility] Glean Hess, MD   1 year ago Essential (primary) hypertension   [facility] Glean Hess, MD   1 year ago Essential (primary) hypertension   [facility] Glean Hess, MD       Future Appointments             In 2 months [name], MD [facility], [facility]

## 2020-07-13 ENCOUNTER — Other Ambulatory Visit: Payer: Self-pay | Admitting: Internal Medicine

## 2020-07-13 DIAGNOSIS — R413 Other amnesia: Secondary | ICD-10-CM

## 2020-07-14 ENCOUNTER — Other Ambulatory Visit: Payer: Self-pay | Admitting: Internal Medicine

## 2020-07-14 DIAGNOSIS — R413 Other amnesia: Secondary | ICD-10-CM

## 2020-08-16 DIAGNOSIS — Z23 Encounter for immunization: Secondary | ICD-10-CM | POA: Diagnosis not present

## 2020-08-30 ENCOUNTER — Other Ambulatory Visit: Payer: Self-pay | Admitting: Internal Medicine

## 2020-08-30 DIAGNOSIS — Z1231 Encounter for screening mammogram for malignant neoplasm of breast: Secondary | ICD-10-CM

## 2020-09-18 ENCOUNTER — Ambulatory Visit
Admission: RE | Admit: 2020-09-18 | Discharge: 2020-09-18 | Disposition: A | Discharge status: Home / Self Care | Payer: Medicare Other | Source: Ambulatory Visit | Attending: Internal Medicine | Admitting: Internal Medicine

## 2020-09-18 ENCOUNTER — Other Ambulatory Visit: Payer: Self-pay

## 2020-09-18 DIAGNOSIS — Z1231 Encounter for screening mammogram for malignant neoplasm of breast: Secondary | ICD-10-CM | POA: Insufficient documentation

## 2020-09-23 ENCOUNTER — Other Ambulatory Visit: Payer: Self-pay | Admitting: Internal Medicine

## 2020-09-23 DIAGNOSIS — R928 Other abnormal and inconclusive findings on diagnostic imaging of breast: Secondary | ICD-10-CM

## 2020-09-23 DIAGNOSIS — N6489 Other specified disorders of breast: Secondary | ICD-10-CM

## 2020-10-01 ENCOUNTER — Encounter: Payer: Medicare Other | Admitting: Internal Medicine

## 2020-10-01 ENCOUNTER — Telehealth: Payer: Self-pay | Admitting: *Deleted

## 2020-10-01 DIAGNOSIS — I700 Atherosclerosis of aorta: Secondary | ICD-10-CM | POA: Insufficient documentation

## 2020-10-01 NOTE — Telephone Encounter (Signed)
VM TO PT ON 2/22 & 3/1 TO SCHEDULE ADDITIONAL IMAGING FROM [facility] - SENDING UPDATED RESULT LETTER WITH # FOR PT TO CALL TO SCHEDULE

## 2020-10-01 NOTE — Progress Notes (Deleted)
Date:  10/01/2020   Name:  Casey Lara   DOB:  1950/12/20   MRN:  [id number]   Chief Complaint: No chief complaint on file.  Casey Lara is a 70 y.o. female who presents today for her Complete Annual Exam. She feels {DESC; WELL/FAIRLY WELL/POORLY:18703}. She reports exercising ***. She reports she is sleeping {DESC; WELL/FAIRLY WELL/POORLY:18703}. Breast complaints ***.  Mammogram: 09/2020 left breast asymmetry DEXA: 2010 Colonoscopy: 05/2013 repeat 10 yrs  Immunization History  Administered Date(s) Administered  . Fluad Quad(high Dose 65+) 05/13/2020  . Influenza, High Dose Seasonal PF 06/14/2018  . Influenza,inj,Quad PF,6+ Mos 06/19/2015, 06/19/2016, 08/19/2017  . PFIZER(Purple Top)SARS-COV-2 Vaccination 09/29/2019, 10/31/2019  . Pneumococcal Conjugate-13 06/19/2016  . Pneumococcal Polysaccharide-23 05/29/2013, 08/22/2018  . Tdap 01/17/2014    Hypertension This is a chronic problem. The problem is controlled. Pertinent negatives include no chest pain, headaches, palpitations or shortness of breath. Past treatments include ACE inhibitors, diuretics, beta blockers and calcium channel blockers. The current treatment provides significant improvement.  Hyperlipidemia This is a chronic problem. The problem is controlled. Pertinent negatives include no chest pain or shortness of breath. Current antihyperlipidemic treatment includes statins. The current treatment provides significant improvement of lipids. There are no compliance problems.   Memory difficulties - on Aricept and Namenda.  Lab Results  Component Value Date   CREATININE 0.77 09/28/2019   BUN 7 (L) 09/28/2019   NA 144 09/28/2019   K 3.6 09/28/2019   CL 104 09/28/2019   CO2 22 09/28/2019   Lab Results  Component Value Date   CHOL 158 09/28/2019   HDL 32 (L) 09/28/2019   LDLCALC 93 09/28/2019   TRIG 188 (H) 09/28/2019   CHOLHDL 4.9 (H) 09/28/2019   Lab Results  Component Value Date   TSH 1.650  09/28/2019   No results found for: HGBA1C Lab Results  Component Value Date   WBC 6.2 09/28/2019   HGB 16.8 (H) 09/28/2019   HCT 49.1 (H) 09/28/2019   MCV 86 09/28/2019   PLT 240 09/28/2019   Lab Results  Component Value Date   ALT 52 (H) 09/28/2019   AST 36 09/28/2019   ALKPHOS 96 09/28/2019   BILITOT 0.3 09/28/2019     Review of Systems  Constitutional: Negative for chills, fatigue and fever.  HENT: Negative for congestion, hearing loss, tinnitus, trouble swallowing and voice change.   Eyes: Negative for visual disturbance.  Respiratory: Negative for cough, chest tightness, shortness of breath and wheezing.   Cardiovascular: Negative for chest pain, palpitations and leg swelling.  Gastrointestinal: Negative for abdominal pain, constipation, diarrhea and vomiting.  Endocrine: Negative for polydipsia and polyuria.  Genitourinary: Negative for dysuria, frequency, genital sores, vaginal bleeding and vaginal discharge.  Musculoskeletal: Negative for arthralgias, gait problem and joint swelling.  Skin: Negative for color change and rash.  Neurological: Negative for dizziness, tremors, light-headedness and headaches.  Hematological: Negative for adenopathy. Does not bruise/bleed easily.  Psychiatric/Behavioral: Positive for confusion. Negative for dysphoric mood and sleep disturbance. The patient is not nervous/anxious.     Patient Active Problem List   Diagnosis Date Noted  . Memory difficulties 12/23/2018  . Tobacco dependence 06/19/2016  . Adenomatous colon polyp 06/19/2015  . Constipation 06/19/2015  . Bilateral low back pain with left-sided sciatica 06/19/2015  . H/O varicella 04/12/2015  . Obstructive apnea 02/11/2015  . History of malignant melanoma of skin 02/11/2015  . Dyslipidemia 02/11/2015  . Allergic state 02/11/2015  . Essential (primary) hypertension 02/11/2015  .  Anxiety 02/11/2015  . Insomnia, persistent 02/11/2015  . CMC arthritis, thumb, degenerative  02/11/2015    No Known Allergies  Past Surgical History:  Procedure Laterality Date  . CARPAL TUNNEL RELEASE Left 2014  . COLONOSCOPY  05/2013   serrated adenoma  . LUMBAR DISC SURGERY    . MELANOMA EXCISION  2015    Social History   Tobacco Use  . Smoking status: Current Every Day Smoker    Packs/day: 1.00    Years: 50.00    Pack years: 50.00    Types: Cigarettes    Start date: 08/03/1966  . Smokeless tobacco: Never Used  . Tobacco comment: smoking since age 74  1 pack daily   Vaping Use  . Vaping Use: Never used  Substance Use Topics  . Alcohol use: No    Alcohol/week: 0.0 standard drinks  . Drug use: No     Medication list has been reviewed and updated.  No outpatient medications have been marked as taking for the 10/01/20 encounter (Appointment) with Glean Hess, MD.    [facility] 2/9 Scores 05/13/2020 01/12/2020 09/28/2019 12/23/2018  PHQ - 2 Score 0 0 0 0  PHQ- 9 Score 0 3 4 0    GAD 7 : Generalized Anxiety Score 05/13/2020 01/12/2020 09/28/2019  Nervous, Anxious, on Edge 0 0 0  Control/stop worrying 0 0 0  Worry too much - different things 0 0 0  Trouble relaxing 0 0 0  Restless 0 0 0  Easily annoyed or irritable 0 0 0  Afraid - awful might happen 0 0 0  Total GAD 7 Score 0 0 0  Anxiety Difficulty Not difficult at all Not difficult at all Not difficult at all   6CIT Screen 05/13/2020 01/12/2020 09/28/2019 08/22/2018  What Year? 4 points 4 points 4 points 4 points  What month? 3 points 0 points 3 points 0 points  What time? 0 points 3 points 0 points 0 points  Count back from 20 0 points 4 points 4 points 0 points  Months in reverse 4 points 4 points 4 points 2 points  Repeat phrase 6 points 2 points 6 points 4 points  Total Score 17 17 21 10       BP Readings from Last 3 Encounters:  05/13/20 134/78  01/12/20 138/80  09/28/19 124/78    Physical Exam Vitals and nursing note reviewed.  Constitutional:      General: She is not in acute distress.     Appearance: She is well-developed.  HENT:     Head: Normocephalic and atraumatic.     Right Ear: Tympanic membrane and ear canal normal.     Left Ear: Tympanic membrane and ear canal normal.     Nose:     Right Sinus: No maxillary sinus tenderness.     Left Sinus: No maxillary sinus tenderness.  Eyes:     General: No scleral icterus.       Right eye: No discharge.        Left eye: No discharge.     Conjunctiva/sclera: Conjunctivae normal.  Neck:     Thyroid: No thyromegaly.     Vascular: No carotid bruit.  Cardiovascular:     Rate and Rhythm: Normal rate and regular rhythm.     Pulses: Normal pulses.     Heart sounds: Normal heart sounds.  Pulmonary:     Effort: Pulmonary effort is normal. No respiratory distress.     Breath sounds: No wheezing.  Chest:  Breasts:     Right: No mass, nipple discharge, skin change or tenderness.     Left: No mass, nipple discharge, skin change or tenderness.    Abdominal:     General: Bowel sounds are normal.     Palpations: Abdomen is soft.     Tenderness: There is no abdominal tenderness.  Musculoskeletal:     Cervical back: Normal range of motion. No erythema.     Right lower leg: No edema.     Left lower leg: No edema.  Lymphadenopathy:     Cervical: No cervical adenopathy.  Skin:    General: Skin is warm and dry.     Findings: No rash.  Neurological:     Mental Status: She is alert and oriented to person, place, and time.     Cranial Nerves: No cranial nerve deficit.     Sensory: No sensory deficit.     Deep Tendon Reflexes: Reflexes are normal and symmetric.  Psychiatric:        Attention and Perception: Attention normal.        Mood and Affect: Mood normal.     Wt Readings from Last 3 Encounters:  05/13/20 160 lb (72.6 kg)  01/12/20 163 lb (73.9 kg)  09/28/19 157 lb (71.2 kg)    There were no vitals taken for this visit.  Assessment and Plan:

## 2020-10-03 ENCOUNTER — Telehealth: Payer: Self-pay

## 2020-10-03 NOTE — Telephone Encounter (Signed)
Called and left VM for patient to call back to discuss abnormal mammogram and new orders for diagnostic mammo and [place]. Also, patient missed appt this week so need to reschedule her visit with Dr. Army Melia. Left a message for patient or her son Legrand [place]o ( ON DPR ) to call back to discuss this.   CRM created.

## 2020-10-15 ENCOUNTER — Other Ambulatory Visit: Payer: Self-pay | Admitting: Internal Medicine

## 2020-10-15 DIAGNOSIS — R413 Other amnesia: Secondary | ICD-10-CM

## 2020-10-15 NOTE — Telephone Encounter (Signed)
Left message

## 2020-10-15 NOTE — Telephone Encounter (Signed)
Requested medications are due for refill today yes  Requested medications are on the active medication list yes  Last refill 12/12  Last visit 05/2020  Future visit scheduled NO  Notes to clinic OV note in Oct stated to return in 4 months, no visit scheduled.

## 2020-10-15 NOTE — Telephone Encounter (Signed)
Please call pt to schedule appt for HTN.   KP

## 2020-10-18 ENCOUNTER — Other Ambulatory Visit: Payer: Self-pay | Admitting: Internal Medicine

## 2020-10-18 DIAGNOSIS — E785 Hyperlipidemia, unspecified: Secondary | ICD-10-CM

## 2020-10-18 NOTE — Telephone Encounter (Signed)
Requested medications are due for refill today yes  Requested medications are on the active medication list yes  Last refill 07/13/20  Last visit 05/2020  Future visit scheduled no  Notes to clinic Failed protocol of valid labs within 360 days. No upcoming appt scheduled.

## 2020-10-18 NOTE — Telephone Encounter (Signed)
Left voicemail for patient to call back and set up appointment for further refills

## 2020-10-18 NOTE — Telephone Encounter (Signed)
Please call pt to schedule an appt.  KP

## 2020-10-22 ENCOUNTER — Telehealth: Payer: Self-pay | Admitting: *Deleted

## 2020-10-22 NOTE — Telephone Encounter (Signed)
No response from vm's & letters to pt to [facility] / mammo - sending certified letter - removing from list.

## 2020-10-28 ENCOUNTER — Other Ambulatory Visit: Payer: Self-pay

## 2020-10-28 ENCOUNTER — Ambulatory Visit
Admission: RE | Admit: 2020-10-28 | Discharge: 2020-10-28 | Disposition: A | Discharge status: Home / Self Care | Payer: Medicare Other | Source: Ambulatory Visit | Attending: Internal Medicine | Admitting: Internal Medicine

## 2020-10-28 DIAGNOSIS — R928 Other abnormal and inconclusive findings on diagnostic imaging of breast: Secondary | ICD-10-CM | POA: Insufficient documentation

## 2020-10-28 DIAGNOSIS — N6489 Other specified disorders of breast: Secondary | ICD-10-CM | POA: Diagnosis not present

## 2020-10-28 DIAGNOSIS — R922 Inconclusive mammogram: Secondary | ICD-10-CM | POA: Diagnosis not present

## 2020-10-29 ENCOUNTER — Ambulatory Visit (INDEPENDENT_AMBULATORY_CARE_PROVIDER_SITE_OTHER): Payer: Medicare Other | Admitting: Internal Medicine

## 2020-10-29 ENCOUNTER — Encounter: Payer: Self-pay | Admitting: Internal Medicine

## 2020-10-29 VITALS — BP 98/76 | HR 62 | Temp 97.7°F | Ht 62.0 in | Wt 113.0 lb

## 2020-10-29 DIAGNOSIS — R634 Abnormal weight loss: Secondary | ICD-10-CM

## 2020-10-29 DIAGNOSIS — I700 Atherosclerosis of aorta: Secondary | ICD-10-CM | POA: Diagnosis not present

## 2020-10-29 DIAGNOSIS — R739 Hyperglycemia, unspecified: Secondary | ICD-10-CM

## 2020-10-29 DIAGNOSIS — E440 Moderate protein-calorie malnutrition: Secondary | ICD-10-CM | POA: Diagnosis not present

## 2020-10-29 NOTE — Progress Notes (Signed)
Date:  10/29/2020   Name:  Casey Lara   DOB:  16-Sep-1950   MRN:  [id number]   Chief Complaint: Fatigue (Sleeping and eating habits have changed, sleeping all goes to bed 8:00 PM and sleeps past 4:00 PM the next day) and Medication Reaction (Not sure which medication is could be just wants to make sure)  HPI  Lab Results  Component Value Date   CREATININE 0.77 09/28/2019   BUN 7 (L) 09/28/2019   NA 144 09/28/2019   K 3.6 09/28/2019   CL 104 09/28/2019   CO2 22 09/28/2019   Lab Results  Component Value Date   CHOL 158 09/28/2019   HDL 32 (L) 09/28/2019   LDLCALC 93 09/28/2019   TRIG 188 (H) 09/28/2019   CHOLHDL 4.9 (H) 09/28/2019   Lab Results  Component Value Date   TSH 1.650 09/28/2019   No results found for: HGBA1C Lab Results  Component Value Date   WBC 6.2 09/28/2019   HGB 16.8 (H) 09/28/2019   HCT 49.1 (H) 09/28/2019   MCV 86 09/28/2019   PLT 240 09/28/2019   Lab Results  Component Value Date   ALT 52 (H) 09/28/2019   AST 36 09/28/2019   ALKPHOS 96 09/28/2019   BILITOT 0.3 09/28/2019     Review of Systems  Constitutional: Positive for appetite change, fatigue (sleeping all the time) and unexpected weight change (45 lbs since October).  HENT: Negative for trouble swallowing.   Eyes: Negative for visual disturbance.  Respiratory: Negative for chest tightness, shortness of breath and wheezing.   Cardiovascular: Negative for chest pain and leg swelling.  Gastrointestinal: Negative for abdominal pain, blood in stool, constipation and diarrhea.  Genitourinary: Negative for dysuria.  Neurological: Negative for dizziness, syncope, light-headedness and headaches.  Psychiatric/Behavioral: Positive for sleep disturbance (sleeping 16+ hours per day).    Patient Active Problem List   Diagnosis Date Noted  . Aortic atherosclerosis ([place]) 10/01/2020  . Memory difficulties 12/23/2018  . Tobacco dependence 06/19/2016  . Adenomatous colon polyp  06/19/2015  . Constipation 06/19/2015  . Bilateral low back pain with left-sided sciatica 06/19/2015  . H/O varicella 04/12/2015  . Obstructive apnea 02/11/2015  . History of malignant melanoma of skin 02/11/2015  . Dyslipidemia 02/11/2015  . Allergic state 02/11/2015  . Essential (primary) hypertension 02/11/2015  . Anxiety 02/11/2015  . Insomnia, persistent 02/11/2015  . CMC arthritis, thumb, degenerative 02/11/2015    No Known Allergies  Past Surgical History:  Procedure Laterality Date  . CARPAL TUNNEL RELEASE Left 2014  . COLONOSCOPY  05/2013   serrated adenoma  . LUMBAR DISC SURGERY    . MELANOMA EXCISION  2015    Social History   Tobacco Use  . Smoking status: Current Every Day Smoker    Packs/day: 1.00    Years: 50.00    Pack years: 50.00    Types: Cigarettes    Start date: 08/03/1966  . Smokeless tobacco: Never Used  . Tobacco comment: smoking since age 67  1 pack daily   Vaping Use  . Vaping Use: Never used  Substance Use Topics  . Alcohol use: No    Alcohol/week: 0.0 standard drinks  . Drug use: No     Medication list has been reviewed and updated.  Current Meds  Medication Sig  . albuterol (PROVENTIL HFA;VENTOLIN HFA) 108 (90 Base) MCG/ACT inhaler TAKE 2 PUFFS 4 TIMES A DAY  . amLODipine (NORVASC) 5 MG tablet TAKE 1 TABLET BY MOUTH  EVERY DAY  . atorvastatin (LIPITOR) 20 MG tablet TAKE 1 TABLET (20 MG TOTAL) BY MOUTH DAILY AT 6 PM.  . bisoprolol-hydrochlorothiazide (ZIAC) 10-6.25 MG tablet TAKE 1 TABLET BY MOUTH EVERY DAY  . diazepam (VALIUM) 5 MG tablet TAKE 1 TABLET BY MOUTH TWICE A DAY  . donepezil (ARICEPT) 10 MG tablet TAKE 1 TABLET BY MOUTH EVERYDAY AT BEDTIME  . fluticasone (FLONASE) 50 MCG/ACT nasal spray SPRAY 2 SPRAYS INTO EACH NOSTRIL EVERY DAY  . losartan (COZAAR) 25 MG tablet TAKE 2 TABLETS BY MOUTH DAILY (REPLACES 50MG  TABLET)  . memantine (NAMENDA) 5 MG tablet TAKE 1 TABLET BY MOUTH TWICE A DAY    PHQ 2/9 Scores 10/29/2020 05/13/2020  01/12/2020 09/28/2019  PHQ - 2 Score 0 0 0 0  PHQ- 9 Score 8 0 3 4    GAD 7 : Generalized Anxiety Score 10/29/2020 05/13/2020 01/12/2020 09/28/2019  Nervous, Anxious, on Edge 0 0 0 0  Control/stop worrying 0 0 0 0  Worry too much - different things 0 0 0 0  Trouble relaxing 0 0 0 0  Restless 0 0 0 0  Easily annoyed or irritable 0 0 0 0  Afraid - awful might happen 0 0 0 0  Total GAD 7 Score 0 0 0 0  Anxiety Difficulty Not difficult at all Not difficult at all Not difficult at all Not difficult at all    BP Readings from Last 3 Encounters:  10/29/20 98/76  05/13/20 134/78  01/12/20 138/80    Physical Exam Vitals and nursing note reviewed.  Constitutional:      General: She is not in acute distress.    Appearance: She is well-developed. She is not ill-appearing.  HENT:     Head: Normocephalic and atraumatic.  Cardiovascular:     Rate and Rhythm: Normal rate and regular rhythm.     Pulses: Normal pulses.     Heart sounds: No murmur heard.   Pulmonary:     Effort: Pulmonary effort is normal. No respiratory distress.     Breath sounds: No wheezing or rhonchi.  Abdominal:     General: Abdomen is flat.     Palpations: Abdomen is soft. There is no mass.     Tenderness: There is no abdominal tenderness.  Musculoskeletal:     Cervical back: Normal range of motion.     Right lower leg: No edema.     Left lower leg: No edema.  Lymphadenopathy:     Cervical: No cervical adenopathy.  Skin:    General: Skin is warm and dry.     Findings: No rash.  Neurological:     General: No focal deficit present.     Mental Status: She is alert and oriented to person, place, and time.  Psychiatric:        Attention and Perception: Attention normal.        Mood and Affect: Mood normal.        Behavior: Behavior normal.        Cognition and Memory: Memory is impaired.     Wt Readings from Last 3 Encounters:  10/29/20 113 lb (51.3 kg)  05/13/20 160 lb (72.6 kg)  01/12/20 163 lb (73.9 kg)     BP 98/76   Pulse 62   Temp 97.7 F (36.5 C) (Oral)   Ht 5\' 2"  (1.575 m)   Wt 113 lb (51.3 kg)   SpO2 97%   BMI 20.67 kg/m   Assessment and Plan: 1. Weight  loss, unintentional With decreased appetite and fatigue Rule out thyroid disease, DM Continue to try to consume at least 2 meals per day and add Ensure/Boost - CBC with Differential/Platelet - Comprehensive metabolic panel - [id number]  2. Aortic atherosclerosis ([facility]) Continue statin therapy - Lipid panel  3. Elevated serum glucose 101 6 months ago  - Hemoglobin A1c  4. Moderate protein-calorie malnutrition ([place]) Await labs for recommendations   Partially dictated using Editor, commissioning. Any errors are unintentional.  Halina Maidens, MD [facility]  10/29/2020

## 2020-10-30 ENCOUNTER — Other Ambulatory Visit: Payer: Self-pay | Admitting: Internal Medicine

## 2020-10-30 DIAGNOSIS — R634 Abnormal weight loss: Secondary | ICD-10-CM

## 2020-10-30 LAB — CBC WITH DIFFERENTIAL/PLATELET
Basophils Absolute: 0 10*3/uL (ref 0.0–0.2)
Basos: 0 %
EOS (ABSOLUTE): 0 10*3/uL (ref 0.0–0.4)
Eos: 0 %
Hematocrit: 48.6 % — ABNORMAL HIGH (ref 34.0–46.6)
Hemoglobin: 16.6 g/dL — ABNORMAL HIGH (ref 11.1–15.9)
Immature Grans (Abs): 0 10*3/uL (ref 0.0–0.1)
Immature Granulocytes: 0 %
Lymphocytes Absolute: 2.3 10*3/uL (ref 0.7–3.1)
Lymphs: 24 %
MCH: 29.4 pg (ref 26.6–33.0)
MCHC: 34.2 g/dL (ref 31.5–35.7)
MCV: 86 fL (ref 79–97)
Monocytes Absolute: 0.5 10*3/uL (ref 0.1–0.9)
Monocytes: 5 %
Neutrophils Absolute: 6.7 10*3/uL (ref 1.4–7.0)
Neutrophils: 71 %
Platelets: 196 10*3/uL (ref 150–450)
RBC: 5.65 x10E6/uL — ABNORMAL HIGH (ref 3.77–5.28)
RDW: 12.6 % (ref 11.7–15.4)
WBC: 9.6 10*3/uL (ref 3.4–10.8)

## 2020-10-30 LAB — TSH+T4F+T3FREE
Free T4: 1.75 ng/dL (ref 0.82–1.77)
T3, Free: 2.1 pg/mL (ref 2.0–4.4)
TSH: 2.19 u[IU]/mL (ref 0.450–4.500)

## 2020-10-30 LAB — LIPID PANEL
Chol/HDL Ratio: 4 ratio (ref 0.0–4.4)
Cholesterol, Total: 108 mg/dL (ref 100–199)
HDL: 27 mg/dL — ABNORMAL LOW (ref >39)
LDL Chol Calc (NIH): 53 mg/dL (ref 0–99)
Triglycerides: 166 mg/dL — ABNORMAL HIGH (ref 0–149)
VLDL Cholesterol Cal: 28 mg/dL (ref 5–40)

## 2020-10-30 LAB — COMPREHENSIVE METABOLIC PANEL
ALT: 42 IU/L — ABNORMAL HIGH (ref 0–32)
AST: 37 IU/L (ref 0–40)
Albumin/Globulin Ratio: 1.6 (ref 1.2–2.2)
Albumin: 4.2 g/dL (ref 3.8–4.8)
Alkaline Phosphatase: 69 IU/L (ref 44–121)
BUN/Creatinine Ratio: 28 (ref 12–28)
BUN: 30 mg/dL — ABNORMAL HIGH (ref 8–27)
Bilirubin Total: 0.7 mg/dL (ref 0.0–1.2)
CO2: 22 mmol/L (ref 20–29)
Calcium: 9.8 mg/dL (ref 8.7–10.3)
Chloride: 102 mmol/L (ref 96–106)
Creatinine, Ser: 1.09 mg/dL — ABNORMAL HIGH (ref 0.57–1.00)
Globulin, Total: 2.6 g/dL (ref 1.5–4.5)
Glucose: 103 mg/dL — ABNORMAL HIGH (ref 65–99)
Potassium: 3.2 mmol/L — ABNORMAL LOW (ref 3.5–5.2)
Sodium: 142 mmol/L (ref 134–144)
Total Protein: 6.8 g/dL (ref 6.0–8.5)
eGFR: 55 mL/min/{1.73_m2} — ABNORMAL LOW (ref >59)

## 2020-10-30 LAB — HEMOGLOBIN A1C
Est. average glucose Bld gHb Est-mCnc: 123 mg/dL
Hgb A1c MFr Bld: 5.9 % — ABNORMAL HIGH (ref 4.8–5.6)

## 2020-10-30 MED ORDER — MIRTAZAPINE 15 MG PO TABS: 15.0000 mg | ORAL_TABLET | Freq: Every day | ORAL | 2 refills | Status: DC

## 2020-10-31 ENCOUNTER — Other Ambulatory Visit: Payer: Self-pay | Admitting: Internal Medicine

## 2020-10-31 DIAGNOSIS — R413 Other amnesia: Secondary | ICD-10-CM

## 2020-10-31 DIAGNOSIS — I10 Essential (primary) hypertension: Secondary | ICD-10-CM

## 2020-10-31 DIAGNOSIS — E785 Hyperlipidemia, unspecified: Secondary | ICD-10-CM

## 2020-11-03 ENCOUNTER — Other Ambulatory Visit: Payer: Self-pay

## 2020-11-03 ENCOUNTER — Inpatient Hospital Stay
Admission: EM | Admit: 2020-11-03 | Discharge: 2020-11-05 | DRG: 640 | Disposition: A | Discharge status: Home / Self Care | Payer: Medicare Other | Attending: Internal Medicine | Admitting: Internal Medicine

## 2020-11-03 ENCOUNTER — Emergency Department: Payer: Medicare Other

## 2020-11-03 DIAGNOSIS — R413 Other amnesia: Secondary | ICD-10-CM | POA: Diagnosis present

## 2020-11-03 DIAGNOSIS — E785 Hyperlipidemia, unspecified: Secondary | ICD-10-CM | POA: Diagnosis present

## 2020-11-03 DIAGNOSIS — T50905A Adverse effect of unspecified drugs, medicaments and biological substances, initial encounter: Secondary | ICD-10-CM

## 2020-11-03 DIAGNOSIS — E440 Moderate protein-calorie malnutrition: Secondary | ICD-10-CM | POA: Diagnosis present

## 2020-11-03 DIAGNOSIS — R918 Other nonspecific abnormal finding of lung field: Secondary | ICD-10-CM | POA: Diagnosis present

## 2020-11-03 DIAGNOSIS — R630 Anorexia: Secondary | ICD-10-CM | POA: Diagnosis present

## 2020-11-03 DIAGNOSIS — G9341 Metabolic encephalopathy: Secondary | ICD-10-CM | POA: Diagnosis not present

## 2020-11-03 DIAGNOSIS — T43025A Adverse effect of tetracyclic antidepressants, initial encounter: Secondary | ICD-10-CM | POA: Diagnosis present

## 2020-11-03 DIAGNOSIS — R0902 Hypoxemia: Secondary | ICD-10-CM

## 2020-11-03 DIAGNOSIS — T502X5A Adverse effect of carbonic-anhydrase inhibitors, benzothiadiazides and other diuretics, initial encounter: Secondary | ICD-10-CM | POA: Diagnosis present

## 2020-11-03 DIAGNOSIS — F17210 Nicotine dependence, cigarettes, uncomplicated: Secondary | ICD-10-CM | POA: Diagnosis present

## 2020-11-03 DIAGNOSIS — Z6820 Body mass index (BMI) 20.0-20.9, adult: Secondary | ICD-10-CM

## 2020-11-03 DIAGNOSIS — R634 Abnormal weight loss: Secondary | ICD-10-CM

## 2020-11-03 DIAGNOSIS — E876 Hypokalemia: Secondary | ICD-10-CM | POA: Diagnosis present

## 2020-11-03 DIAGNOSIS — Z85820 Personal history of malignant melanoma of skin: Secondary | ICD-10-CM

## 2020-11-03 DIAGNOSIS — R519 Headache, unspecified: Secondary | ICD-10-CM | POA: Diagnosis not present

## 2020-11-03 DIAGNOSIS — F0390 Unspecified dementia without behavioral disturbance: Secondary | ICD-10-CM | POA: Diagnosis present

## 2020-11-03 DIAGNOSIS — Z79899 Other long term (current) drug therapy: Secondary | ICD-10-CM | POA: Diagnosis not present

## 2020-11-03 DIAGNOSIS — Z803 Family history of malignant neoplasm of breast: Secondary | ICD-10-CM | POA: Diagnosis not present

## 2020-11-03 DIAGNOSIS — I10 Essential (primary) hypertension: Secondary | ICD-10-CM | POA: Diagnosis present

## 2020-11-03 DIAGNOSIS — F17201 Nicotine dependence, unspecified, in remission: Secondary | ICD-10-CM

## 2020-11-03 DIAGNOSIS — J449 Chronic obstructive pulmonary disease, unspecified: Secondary | ICD-10-CM | POA: Diagnosis not present

## 2020-11-03 DIAGNOSIS — E871 Hypo-osmolality and hyponatremia: Secondary | ICD-10-CM

## 2020-11-03 DIAGNOSIS — R410 Disorientation, unspecified: Secondary | ICD-10-CM | POA: Diagnosis not present

## 2020-11-03 DIAGNOSIS — Z20822 Contact with and (suspected) exposure to covid-19: Secondary | ICD-10-CM | POA: Diagnosis present

## 2020-11-03 DIAGNOSIS — F17200 Nicotine dependence, unspecified, uncomplicated: Secondary | ICD-10-CM

## 2020-11-03 DIAGNOSIS — Z823 Family history of stroke: Secondary | ICD-10-CM

## 2020-11-03 DIAGNOSIS — R4182 Altered mental status, unspecified: Secondary | ICD-10-CM

## 2020-11-03 DIAGNOSIS — G3184 Mild cognitive impairment, so stated: Secondary | ICD-10-CM | POA: Diagnosis present

## 2020-11-03 HISTORY — DX: Hypo-osmolality and hyponatremia: E87.1

## 2020-11-03 HISTORY — DX: Metabolic encephalopathy: G93.41

## 2020-11-03 LAB — COMPREHENSIVE METABOLIC PANEL
ALT: 35 U/L (ref 0–44)
AST: 36 U/L (ref 15–41)
Albumin: 3.9 g/dL (ref 3.5–5.0)
Alkaline Phosphatase: 51 U/L (ref 38–126)
Anion gap: 14 (ref 5–15)
BUN: 14 mg/dL (ref 8–23)
CO2: 19 mmol/L — ABNORMAL LOW (ref 22–32)
Calcium: 8.2 mg/dL — ABNORMAL LOW (ref 8.9–10.3)
Chloride: 86 mmol/L — ABNORMAL LOW (ref 98–111)
Creatinine, Ser: 0.63 mg/dL (ref 0.44–1.00)
GFR, Estimated: >60 mL/min (ref >60)
Glucose, Bld: 136 mg/dL — ABNORMAL HIGH (ref 70–99)
Potassium: 2.4 mmol/L — CL (ref 3.5–5.1)
Sodium: 119 mmol/L — CL (ref 135–145)
Total Bilirubin: 1.2 mg/dL (ref 0.3–1.2)
Total Protein: 7.1 g/dL (ref 6.5–8.1)

## 2020-11-03 LAB — DIFFERENTIAL
Abs Immature Granulocytes: 0.06 10*3/uL (ref 0.00–0.07)
Basophils Absolute: 0 10*3/uL (ref 0.0–0.1)
Basophils Relative: 0 %
Eosinophils Absolute: 0 10*3/uL (ref 0.0–0.5)
Eosinophils Relative: 0 %
Immature Granulocytes: 1 %
Lymphocytes Relative: 17 %
Lymphs Abs: 2 10*3/uL (ref 0.7–4.0)
Monocytes Absolute: 0.5 10*3/uL (ref 0.1–1.0)
Monocytes Relative: 4 %
Neutro Abs: 9.2 10*3/uL — ABNORMAL HIGH (ref 1.7–7.7)
Neutrophils Relative %: 78 %

## 2020-11-03 LAB — URINALYSIS, COMPLETE (UACMP) WITH MICROSCOPIC
Bilirubin Urine: NEGATIVE
Glucose, UA: NEGATIVE mg/dL
Hgb urine dipstick: NEGATIVE
Ketones, ur: NEGATIVE mg/dL
Leukocytes,Ua: NEGATIVE
Nitrite: NEGATIVE
Protein, ur: NEGATIVE mg/dL
Specific Gravity, Urine: 1.001 — ABNORMAL LOW (ref 1.005–1.030)
Squamous Epithelial / HPF: NONE SEEN (ref 0–5)
WBC, UA: NONE SEEN WBC/hpf (ref 0–5)
pH: 6 (ref 5.0–8.0)

## 2020-11-03 LAB — RESP PANEL BY RT-PCR (FLU A&B, COVID) ARPGX2
Influenza A by PCR: NEGATIVE
Influenza B by PCR: NEGATIVE
SARS Coronavirus 2 by RT PCR: NEGATIVE

## 2020-11-03 LAB — CBC
HCT: 38.6 % (ref 36.0–46.0)
Hemoglobin: 14.1 g/dL (ref 12.0–15.0)
MCH: 29.9 pg (ref 26.0–34.0)
MCHC: 36.5 g/dL — ABNORMAL HIGH (ref 30.0–36.0)
MCV: 81.8 fL (ref 80.0–100.0)
Platelets: 184 10*3/uL (ref 150–400)
RBC: 4.72 MIL/uL (ref 3.87–5.11)
RDW: 11.5 % (ref 11.5–15.5)
WBC: 11.7 10*3/uL — ABNORMAL HIGH (ref 4.0–10.5)
nRBC: 0 % (ref 0.0–0.2)

## 2020-11-03 LAB — OSMOLALITY: Osmolality: 253 mOsm/kg — ABNORMAL LOW (ref 275–295)

## 2020-11-03 LAB — MAGNESIUM: Magnesium: 1.2 mg/dL — ABNORMAL LOW (ref 1.7–2.4)

## 2020-11-03 LAB — CBG MONITORING, ED: Glucose-Capillary: 154 mg/dL — ABNORMAL HIGH (ref 70–99)

## 2020-11-03 LAB — APTT: aPTT: 37 seconds — ABNORMAL HIGH (ref 24–36)

## 2020-11-03 LAB — PROTIME-INR
INR: 1 (ref 0.8–1.2)
Prothrombin Time: 13 seconds (ref 11.4–15.2)

## 2020-11-03 LAB — SODIUM: Sodium: 123 mmol/L — ABNORMAL LOW (ref 135–145)

## 2020-11-03 MED ORDER — ONDANSETRON HCL 4 MG/2ML IJ SOLN: 4.0000 mg | Freq: Four times a day (QID) | INTRAMUSCULAR | Status: DC | PRN

## 2020-11-03 MED ORDER — ACETAMINOPHEN 650 MG RE SUPP: 650.0000 mg | Freq: Four times a day (QID) | RECTAL | Status: DC | PRN

## 2020-11-03 MED ORDER — POTASSIUM CHLORIDE 20 MEQ PO PACK
40.0000 meq | PACK | Freq: Once | ORAL | Status: AC
  Administered 2020-11-04: 40 meq via ORAL
  Filled 2020-11-03: qty 2

## 2020-11-03 MED ORDER — ACETAMINOPHEN 325 MG PO TABS: 650.0000 mg | ORAL_TABLET | Freq: Four times a day (QID) | ORAL | Status: DC | PRN

## 2020-11-03 MED ORDER — SODIUM CHLORIDE 0.9% FLUSH: 3.0000 mL | Freq: Once | INTRAVENOUS | Status: DC

## 2020-11-03 MED ORDER — SODIUM CHLORIDE 0.9 % IV SOLN: Freq: Once | INTRAVENOUS | Status: AC

## 2020-11-03 MED ORDER — MAGNESIUM SULFATE 2 GM/50ML IV SOLN
2.0000 g | Freq: Once | INTRAVENOUS | Status: AC
  Administered 2020-11-03: 2 g via INTRAVENOUS
  Filled 2020-11-03: qty 50

## 2020-11-03 MED ORDER — SODIUM CHLORIDE 0.9 % IV BOLUS
1000.0000 mL | Freq: Once | INTRAVENOUS | Status: AC
  Administered 2020-11-03: 1000 mL via INTRAVENOUS

## 2020-11-03 MED ORDER — SODIUM CHLORIDE 0.9 % IV SOLN: INTRAVENOUS | Status: DC

## 2020-11-03 MED ORDER — ENOXAPARIN SODIUM 40 MG/0.4ML ~~LOC~~ SOLN
40.0000 mg | SUBCUTANEOUS | Status: DC
  Administered 2020-11-04: 40 mg via SUBCUTANEOUS
  Filled 2020-11-03: qty 0.4

## 2020-11-03 MED ORDER — ONDANSETRON HCL 4 MG PO TABS: 4.0000 mg | ORAL_TABLET | Freq: Four times a day (QID) | ORAL | Status: DC | PRN

## 2020-11-03 MED ORDER — POTASSIUM CHLORIDE 10 MEQ/100ML IV SOLN: 10.0000 meq | INTRAVENOUS | Status: AC

## 2020-11-03 MED ORDER — POTASSIUM CHLORIDE 10 MEQ/100ML IV SOLN
10.0000 meq | Freq: Once | INTRAVENOUS | Status: AC
  Administered 2020-11-03: 10 meq via INTRAVENOUS
  Filled 2020-11-03: qty 100

## 2020-11-03 NOTE — H&P (Addendum)
History and Physical    Casey Lara OTL:[id number] DOB: Jul 13, 1951 DOA: 11/03/2020  PCP: Glean Hess, MD   Patient coming from: Home  I have personally briefly reviewed patient's old medical records in [place]  Chief Complaint: Confusion  HPI: Casey Lara is a 70 y.o. female with medical history significant for HTN, Dementia, nicotine dependence, who functions fairly independently at baseline, who was brought to the emergency room after her son found her in the garage, confused and urine, which is not usual for her.  History is limited due to her confusion and history is taken from son and review of recent records.  Patient was seen by her PCP within the past week with a complaint of excessive somnolence, and  weight loss.  She was prescribed mirtazapine for appetite.additionally, she was advised to drink lots of water for suspected dehydration which she has been doing.   She has not had any recent illnesses such as cough, fever or chills, shortness of breath or chest pain.  She has had no nausea, vomiting or abdominal pain or change in bowel habits and has had no complaints of headache or visual disturbance and was for the most part at her baseline, with last known normal the previous night. Son states that when she was seen by her PCP 5 days prior, ED course: On arrival BP 121/78, pulse 75, respirations 18 with O2 sat 98% on room air and temperature 97.9.  Blood work notable for sodium of 119 and potassium of 2.4 with bicarb of 19.  Magnesium 1.2,WBC 11,000 hemoglobin normal at 14.  Urinalysis unremarkable. EKG, reviewed and interpreted by myself: Shows NSR at 77 with nonspecific ST-T wave changes.  Due to artifact, will repeat, currently pending Imaging: CT head with no acute intracranial abnormalities Chest x-ray: COPD.  Bibasilar atelectasis or scarring  Patient started on a normal saline fluid bolus.  Hospitalist consulted for admission  Review of Systems:  Limited due to confusion   Past Medical History:  Diagnosis Date  . Cancer ([place])    melanoma  . Hypercholesteremia   . Hypertension     Past Surgical History:  Procedure Laterality Date  . CARPAL TUNNEL RELEASE Left 2014  . COLONOSCOPY  05/2013   serrated adenoma  . LUMBAR DISC SURGERY    . MELANOMA EXCISION  2015     reports that she has been smoking cigarettes. She started smoking about 54 years ago. She has a 50.00 pack-year smoking history. She has never used smokeless tobacco. She reports that she does not drink alcohol and does not use drugs.  No Known Allergies  Family History  Problem Relation Age of Onset  . Stroke Mother   . Breast cancer Mother 74  . Cancer Father   . Breast cancer Maternal Grandmother   . Breast cancer Paternal Grandmother   . Breast cancer Sister 69  . Breast cancer Cousin        mat cousin      Prior to Admission medications   Medication Sig Start Date End Date Taking? Authorizing Provider  albuterol (PROVENTIL HFA;VENTOLIN HFA) 108 (90 Base) MCG/ACT inhaler TAKE 2 PUFFS 4 TIMES A DAY 11/14/18   Glean Hess, MD  amLODipine (NORVASC) 5 MG tablet TAKE 1 TABLET BY MOUTH EVERY DAY 04/21/20   Glean Hess, MD  atorvastatin (LIPITOR) 20 MG tablet TAKE 1 TABLET BY MOUTH DAILY AT 6 PM. 10/31/20   Glean Hess, MD  bisoprolol-hydrochlorothiazide [facility]) 10-6.25  MG tablet TAKE 1 TABLET BY MOUTH EVERY DAY 10/31/20   Glean Hess, MD  diazepam (VALIUM) 5 MG tablet TAKE 1 TABLET BY MOUTH TWICE A DAY 07/04/20   Glean Hess, MD  donepezil (ARICEPT) 10 MG tablet TAKE 1 TABLET BY MOUTH EVERYDAY AT BEDTIME 10/31/20   Glean Hess, MD  fluticasone [facility]) 50 MCG/ACT nasal spray SPRAY 2 SPRAYS INTO EACH NOSTRIL EVERY DAY 01/16/19   Glean Hess, MD  losartan (COZAAR) 25 MG tablet TAKE 2 TABLETS BY MOUTH DAILY (REPLACES 50MG  TABLET) 04/21/20   Glean Hess, MD  memantine (NAMENDA) 5 MG tablet TAKE 1 TABLET BY MOUTH TWICE A  DAY 10/15/20   Glean Hess, MD  mirtazapine (REMERON) 15 MG tablet Take 1 tablet (15 mg total) by mouth at bedtime. 10/30/20   Glean Hess, MD    Physical Exam: Vitals:   11/03/20 2015 11/03/20 2030 11/03/20 2045 11/03/20 2056  BP: (!) 144/80 125/75  112/71  Pulse: 73 68 73 71  Resp:   (!) 25 (!) 21  Temp:      TempSrc:      SpO2: 93% 98% 96% 97%  Weight:      Height:         Vitals:   11/03/20 2015 11/03/20 2030 11/03/20 2045 11/03/20 2056  BP: (!) 144/80 125/75  112/71  Pulse: 73 68 73 71  Resp:   (!) 25 (!) 21  Temp:      TempSrc:      SpO2: 93% 98% 96% 97%  Weight:      Height:          Constitutional:  Lethargic and oriented x 2 . Not in any apparent distress HEENT:      Head: Normocephalic and atraumatic.         Eyes: PERLA, EOMI, Conjunctivae are normal. Sclera is non-icteric.       Mouth/Throat: Mucous membranes are moist.       Neck: Supple with no signs of meningismus. Cardiovascular: Regular rate and rhythm. No murmurs, gallops, or rubs. 2+ symmetrical distal pulses are present . No JVD. No LE edema Respiratory: Respiratory effort normal .Lungs sounds clear bilaterally. No wheezes, crackles, or rhonchi.  Gastrointestinal: Soft, non tender, and non distended with positive bowel sounds.  Genitourinary: No CVA tenderness. Musculoskeletal: Nontender with normal range of motion in all extremities. No cyanosis, or erythema of extremities. Neurologic:  Face is symmetric. Moving all extremities. No gross focal neurologic deficits . Skin: Skin is warm, dry.  No rash or ulcers Psychiatric: Mood and affect are normal    Labs on Admission: I have personally reviewed following labs and imaging studies  CBC: Recent Labs  Lab 10/29/20 1644 11/03/20 1942  WBC 9.6 11.7*  NEUTROABS 6.7 9.2*  HGB 16.6* 14.1  HCT 48.6* 38.6  MCV 86 81.8  PLT 196 979   Basic Metabolic Panel: Recent Labs  Lab 10/29/20 1644 11/03/20 1942  NA 142 119*  K 3.2* 2.4*   CL 102 86*  CO2 22 19*  GLUCOSE 103* 136*  BUN 30* 14  CREATININE 1.09* 0.63  CALCIUM 9.8 8.2*  MG  --  1.2*   GFR: Estimated Creatinine Clearance: 52.5 mL/min (by C-G formula based on SCr of 0.63 mg/dL). Liver Function Tests: Recent Labs  Lab 10/29/20 1644 11/03/20 1942  AST 37 36  ALT 42* 35  ALKPHOS 69 51  BILITOT 0.7 1.2  PROT 6.8 7.1  ALBUMIN 4.2 3.9  No results for input(s): LIPASE, AMYLASE in the last 168 hours. No results for input(s): AMMONIA in the last 168 hours. Coagulation Profile: Recent Labs  Lab 11/03/20 1942  INR 1.0   Cardiac Enzymes: No results for input(s): CKTOTAL, CKMB, CKMBINDEX, TROPONINI in the last 168 hours. BNP (last 3 results) No results for input(s): PROBNP in the last 8760 hours. HbA1C: No results for input(s): HGBA1C in the last 72 hours. CBG: Recent Labs  Lab 11/03/20 1936  GLUCAP 154*   Lipid Profile: No results for input(s): CHOL, HDL, LDLCALC, TRIG, CHOLHDL, LDLDIRECT in the last 72 hours. Thyroid Function Tests: No results for input(s): TSH, T4TOTAL, FREET4, T3FREE, THYROIDAB in the last 72 hours. Anemia Panel: No results for input(s): VITAMINB12, FOLATE, FERRITIN, TIBC, IRON, RETICCTPCT in the last 72 hours. Urine analysis:    Component Value Date/Time   COLORURINE COLORLESS (A) 11/03/2020 1942   APPEARANCEUR CLEAR (A) 11/03/2020 1942   LABSPEC 1.001 (L) 11/03/2020 1942   PHURINE 6.0 11/03/2020 1942   GLUCOSEU NEGATIVE 11/03/2020 1942   HGBUR NEGATIVE 11/03/2020 1942   BILIRUBINUR NEGATIVE 11/03/2020 1942   BILIRUBINUR neg 08/22/2018 1111   [place] 11/03/2020 1942   PROTEINUR NEGATIVE 11/03/2020 1942   UROBILINOGEN 0.2 08/22/2018 1111   NITRITE NEGATIVE 11/03/2020 1942   LEUKOCYTESUR NEGATIVE 11/03/2020 1942    Radiological Exams on Admission: CT HEAD WO CONTRAST  Result Date: 11/03/2020 CLINICAL DATA:  Headache and confusion. Started new medications 2 days ago. EXAM: CT HEAD WITHOUT CONTRAST  TECHNIQUE: Contiguous axial images were obtained from the base of the skull through the vertex without intravenous contrast. COMPARISON:  None. FINDINGS: Brain: No evidence of acute infarction, hemorrhage, hydrocephalus, extra-axial collection or mass lesion/mass effect. Mild cerebral atrophy. Patchy low-attenuation changes in the deep white matter suggesting small vessel ischemic change. Vascular: Intracranial arterial vascular calcifications are present. Skull: Calvarium appears intact. Sinuses/Orbits: Paranasal sinuses and mastoid air cells are clear. Other: None. IMPRESSION: 1. No acute intracranial abnormalities. 2. Mild cerebral atrophy and small vessel ischemic changes. Electronically Signed   By: Lucienne Capers M.D.   On: 11/03/2020 20:06   DG Chest Portable 1 View  Result Date: 11/03/2020 CLINICAL DATA:  Altered mental status EXAM: PORTABLE CHEST 1 VIEW COMPARISON:  06/19/2016 FINDINGS: There is hyperinflation of the lungs compatible with COPD. Heart is normal size. Bibasilar scarring or atelectasis. No effusions. No acute bony abnormality. IMPRESSION: COPD.  Bibasilar atelectasis or scarring. Electronically Signed   By: Rolm Baptise M.D.   On: 11/03/2020 20:56     Assessment/Plan 70 year old female with history of HTN,Dementia on Aricept and Namenda,, nicotine dependence, pulmonary nodules on lung cancer screening in 2020,who functions fairly independently at baseline, presenting with acute confusion. Acute electrolyte derangements, notably potassium of 119 on blood work, new compared to 142 on blood work done by PCP 5 days prior when new medication prescribed and increased water intake advised.    Acute metabolic encephalopathy -Secondary to acute electrolyte derangements, notably sodium of 119, superimposed on background of dementia -CT head with no acute intracranial abnormalities -We will keep n.p.o. for now -Fall and aspiration precautions -Treat etiology as outlined below  Acute  symptomatic hyponatremia,  Suspect medication and polydipsia -Patient presenting with acute confusion and Sodium 119, down from 142, 5 days prior at PCPs office, in the setting of new prescription medication starting 2 days prior and increased water intake -Head CT with no acute intracranial findings -Patient was bolused with 2 L NS in the ER -Follow  serum and urine osmolality and urine sodium to initial labs to help determine etiology of hyponatremia - Serum and urine osmolalities both very decreased -Nephrology consult to assist with electrolyte management -Addendum: Post admission on serial sodium checks, overcorrection of sodium ~119/123/131, so D5 infusion started -Addendum #2: Sodium continues to overcorrect 119/123/131/135.  No neurologic deficit. -Desmopressin ordered -Discussed with Dr. Murlean Iba via telephone who is in agreement with desmopressin and will see patient    Hypokalemia -IV repletion and monitor  Hypomagnesemia -IV repletion and monitor  Unintentional weight loss Pulmonary nodules(lung RADS 2 in 2020) -Son states that patient has lost over 20 pounds in the past few months, reason why she was taken to the PCP -Patient being evaluated and monitored by PCP -Had an unremarkable mammogram recently, had a lung RADS scan of 2 in 2020 -Discontinue mirtazapine ordered on 3/29 as appetite stimulant  Dementia -Continue Aricept and Namenda    Essential (primary) hypertension -Continue home meds    Nicotine dependence -Nicotine patch    DVT prophylaxis: Lovenox  Code Status: full code  Family Communication: Son at bedside Disposition Plan: Back to previous home environment Consults called: Nephrology Status:At the time of admission, it appears that the appropriate admission status for this patient is INPATIENT. This is judged to be reasonable and necessary in order to provide the required intensity of service to ensure the patient's safety given the presenting  symptoms, physical exam findings, and initial radiographic and laboratory data in the context of their  Comorbid conditions.   Patient requires inpatient status due to high intensity of service, high risk for further deterioration and high frequency of surveillance required.   I certify that at the point of admission it is my clinical judgment that the patient will require inpatient hospital care spanning beyond [place] MD [facility]     11/03/2020, 9:50 PM

## 2020-11-03 NOTE — ED Notes (Signed)
Pt assisted to bathroom. Clothes drenched in urine. Pt given new clothes and brief. Clothing placed in belongings bag attached to wheelchair. Son remains with pt waiting for triage.

## 2020-11-03 NOTE — ED Notes (Signed)
Pt's son states pt has lost 40 lbs in the last month. Pt alert, answering some questions Following commands. EKG attempted, unable to get accurate EKG due to pt's tremors.

## 2020-11-03 NOTE — ED Notes (Signed)
Patient had incontinent episode of urine. Patient cleaned, fresh sheets, pads, and incontinence brief. Placed in clean gown. Given warm blankets and yellow socks.  Son remains bedside.

## 2020-11-03 NOTE — ED Notes (Signed)
Critical results received  K: 2.4 Na: [place], MD informed.

## 2020-11-03 NOTE — ED Provider Notes (Signed)
[facility] Emergency Department Provider Note  Time seen: 8:29 PM  I have reviewed the triage vital signs and the nursing notes.   HISTORY  Chief Complaint Altered Mental Status   HPI Casey Lara is a 70 y.o. female with a past medical history of hypertension, hyperlipidemia, anxiety, presents to the emergency department for altered mental status.  According to the son who lives with the patient she is normally high functioning, cares for herself.  States they watched a movie yesterday and she was normal.  He states he came home today and found her in the garage where she typically goes to smoke cigarettes, sitting in a chair very confused and had urinated on herself.  Here the patient is tremulous disoriented, confused.  Son states this is very atypical and he has never seen her like this.  Denies any alcohol use.  States she was recently started on mirtazapine 2 days ago, but he gives her her medications and there is no chance of an overdose.Marland Kitchen   Past Medical History:  Diagnosis Date  . Cancer ([place])    melanoma  . Hypercholesteremia   . Hypertension     Patient Active Problem List   Diagnosis Date Noted  . Moderate protein-calorie malnutrition ([place]) 10/29/2020  . Aortic atherosclerosis ([place]) 10/01/2020  . Memory difficulties 12/23/2018  . Tobacco dependence 06/19/2016  . Adenomatous colon polyp 06/19/2015  . Constipation 06/19/2015  . Bilateral low back pain with left-sided sciatica 06/19/2015  . H/O varicella 04/12/2015  . Obstructive apnea 02/11/2015  . History of malignant melanoma of skin 02/11/2015  . Dyslipidemia 02/11/2015  . Allergic state 02/11/2015  . Essential (primary) hypertension 02/11/2015  . Anxiety 02/11/2015  . Insomnia, persistent 02/11/2015  . CMC arthritis, thumb, degenerative 02/11/2015    Past Surgical History:  Procedure Laterality Date  . CARPAL TUNNEL RELEASE Left 2014  . COLONOSCOPY  05/2013   serrated adenoma   . LUMBAR DISC SURGERY    . MELANOMA EXCISION  2015    Prior to Admission medications   Medication Sig Start Date End Date Taking? Authorizing Provider  albuterol (PROVENTIL HFA;VENTOLIN HFA) 108 (90 Base) MCG/ACT inhaler TAKE 2 PUFFS 4 TIMES A DAY 11/14/18   Glean Hess, MD  amLODipine (NORVASC) 5 MG tablet TAKE 1 TABLET BY MOUTH EVERY DAY 04/21/20   Glean Hess, MD  atorvastatin (LIPITOR) 20 MG tablet TAKE 1 TABLET BY MOUTH DAILY AT 6 PM. 10/31/20   Glean Hess, MD  bisoprolol-hydrochlorothiazide [facility]) 10-6.25 MG tablet TAKE 1 TABLET BY MOUTH EVERY DAY 10/31/20   Glean Hess, MD  diazepam (VALIUM) 5 MG tablet TAKE 1 TABLET BY MOUTH TWICE A DAY 07/04/20   Glean Hess, MD  donepezil (ARICEPT) 10 MG tablet TAKE 1 TABLET BY MOUTH EVERYDAY AT BEDTIME 10/31/20   Glean Hess, MD  fluticasone [facility]) 50 MCG/ACT nasal spray SPRAY 2 SPRAYS INTO EACH NOSTRIL EVERY DAY 01/16/19   Glean Hess, MD  losartan (COZAAR) 25 MG tablet TAKE 2 TABLETS BY MOUTH DAILY (REPLACES 50MG  TABLET) 04/21/20   Glean Hess, MD  memantine (NAMENDA) 5 MG tablet TAKE 1 TABLET BY MOUTH TWICE A DAY 10/15/20   Glean Hess, MD  mirtazapine (REMERON) 15 MG tablet Take 1 tablet (15 mg total) by mouth at bedtime. 10/30/20   Glean Hess, MD    No Known Allergies  Family History  Problem Relation Age of Onset  . Stroke Mother   .  Breast cancer Mother 33  . Cancer Father   . Breast cancer Maternal Grandmother   . Breast cancer Paternal Grandmother   . Breast cancer Sister 42  . Breast cancer Cousin        mat cousin    Social History Social History   Tobacco Use  . Smoking status: Current Every Day Smoker    Packs/day: 1.00    Years: 50.00    Pack years: 50.00    Types: Cigarettes    Start date: 08/03/1966  . Smokeless tobacco: Never Used  . Tobacco comment: smoking since age 46  1 pack daily   Vaping Use  . Vaping Use: Never used  Substance Use Topics  . Alcohol  use: No    Alcohol/week: 0.0 standard drinks  . Drug use: No    Review of Systems Unable to obtain adequate/accurate review of systems secondary to altered mental status ____________________________________________   PHYSICAL EXAM:  VITAL SIGNS: ED Triage Vitals  Enc Vitals Group     BP 11/03/20 1935 121/78     Pulse Rate 11/03/20 1935 75     Resp 11/03/20 1935 18     Temp 11/03/20 1935 97.9 F (36.6 C)     Temp Source 11/03/20 1935 Oral     SpO2 11/03/20 1935 98 %     Weight 11/03/20 1936 117 lb (53.1 kg)     Height 11/03/20 1936 5\' 2"  (1.575 m)     Head Circumference --      Peak Flow --      Pain Score 11/03/20 1936 0     Pain Loc --      Pain Edu? --      Excl. in [place]? --     Constitutional: Patient is awake, she is oriented to person only is confused but does follow basic commands.  Tremulous. Eyes: Normal exam ENT      Head: Normocephalic and atraumatic.      Mouth/Throat: Mucous membranes are moist. Cardiovascular: Normal rate, regular rhythm.  Respiratory: Normal respiratory effort without tachypnea nor retractions. Breath sounds are clear Gastrointestinal: Soft and nontender. No distention. Musculoskeletal: Nontender with normal range of motion in all extremities.  Neurologic: Overall normal language but confused and slow with responses.  Moves all extremities Skin:  Skin is warm, dry and intact.  Psychiatric: Mood and affect are normal.   ____________________________________________    EKG  EKG viewed and interpreted by myself shows appears to be sinus rhythm at 77 bpm with a narrow QRS, normal axis, largely normal intervals but significant interference due to tremor.  ____________________________________________    RADIOLOGY  CT head negative for acute abnormality  ____________________________________________   INITIAL IMPRESSION / ASSESSMENT AND PLAN / ED COURSE  Pertinent labs & imaging results that were available during my care of the  patient were reviewed by me and considered in my medical decision making (see chart for details).   Patient presents emergency department for confusion, altered mental status, tremulous.  Son denies any alcohol use, states she has lost a considerable amount of weight over the past 6 months for unknown reasons.  Recently started on mirtazapine 2 days ago.  Unknown reason for her altered state.  We will check labs, CT scan of the head and continue to closely monitor.  Given her recent weight loss will also obtain a chest x-ray to rule out oncologic process.  CT scan of the head is negative however patient's labs have begun to result showing  significant electrolyte abnormalities including hyponatremia with a sodium of 119 and hypokalemia.  I have added on a magnesium level.  We will begin IV hydration with normal saline as well as IV potassium.  Patient will require admission to the hospital service once her emergency department work-up is been completed.  Casey Lara was evaluated in Emergency Department on 11/03/2020 for the symptoms described in the history of present illness. She was evaluated in the context of the global COVID-19 pandemic, which necessitated consideration that the patient might be at risk for infection with the SARS-CoV-2 virus that causes COVID-19. Institutional protocols and algorithms that pertain to the evaluation of patients at risk for COVID-19 are in a state of rapid change based on information released by regulatory bodies including the CDC and federal and state organizations. These policies and algorithms were followed during the patient's care in the ED.  CRITICAL CARE Performed by: Harvest Dark   Total critical care time: 30 minutes  Critical care time was exclusive of separately billable procedures and treating other patients.  Critical care was necessary to treat or prevent imminent or life-threatening deterioration.  Critical care was time spent  personally by me on the following activities: development of treatment plan with patient and/or surrogate as well as nursing, discussions with consultants, evaluation of patient's response to treatment, examination of patient, obtaining history from patient or surrogate, ordering and performing treatments and interventions, ordering and review of laboratory studies, ordering and review of radiographic studies, pulse oximetry and re-evaluation of patient's condition.  ____________________________________________   FINAL CLINICAL IMPRESSION(S) / ED DIAGNOSES  Hyponatremia Altered mental status Hypokalemia   Harvest Dark, MD 11/03/20 2037

## 2020-11-03 NOTE — ED Notes (Signed)
Seizure pads set up on stretcher rails, suction and O2 tubing bedside. Bed alarm turned on underneath patient. Given warm blanket.

## 2020-11-03 NOTE — ED Notes (Signed)
NS stopped per Damita Dunnings, MD.  Per Damita Dunnings, MD swallow screen is to be evaluated to substitute IV potassium for PO.  Safety sitter bedside.

## 2020-11-03 NOTE — ED Triage Notes (Signed)
Pt is usually A&O x 4, pt lives with son, Pandora Leiter found pt  in the yard, soaked in urine and confused. Pt started taking new meds 2 days ago, an appetite stimulant. Pt LKW 10 am this morning per son. Pt is oriented to self and somewhat to situation but not to date or time. Per son, pt is usually able to take care of herself. Pt appears disheveled, confused, pt struggles to follow directions. Pt having tremors.

## 2020-11-04 ENCOUNTER — Inpatient Hospital Stay: Payer: Medicare Other

## 2020-11-04 ENCOUNTER — Encounter: Payer: Self-pay | Admitting: Internal Medicine

## 2020-11-04 DIAGNOSIS — E876 Hypokalemia: Secondary | ICD-10-CM

## 2020-11-04 DIAGNOSIS — G9341 Metabolic encephalopathy: Secondary | ICD-10-CM

## 2020-11-04 DIAGNOSIS — I10 Essential (primary) hypertension: Secondary | ICD-10-CM

## 2020-11-04 LAB — BASIC METABOLIC PANEL
Anion gap: 6 (ref 5–15)
BUN: 9 mg/dL (ref 8–23)
CO2: 24 mmol/L (ref 22–32)
Calcium: 8.3 mg/dL — ABNORMAL LOW (ref 8.9–10.3)
Chloride: 105 mmol/L (ref 98–111)
Creatinine, Ser: 0.79 mg/dL (ref 0.44–1.00)
GFR, Estimated: >60 mL/min (ref >60)
Glucose, Bld: 135 mg/dL — ABNORMAL HIGH (ref 70–99)
Potassium: 3.3 mmol/L — ABNORMAL LOW (ref 3.5–5.1)
Sodium: 135 mmol/L (ref 135–145)

## 2020-11-04 LAB — CBC
HCT: 38.8 % (ref 36.0–46.0)
Hemoglobin: 14.3 g/dL (ref 12.0–15.0)
MCH: 30 pg (ref 26.0–34.0)
MCHC: 36.9 g/dL — ABNORMAL HIGH (ref 30.0–36.0)
MCV: 81.5 fL (ref 80.0–100.0)
Platelets: 174 10*3/uL (ref 150–400)
RBC: 4.76 MIL/uL (ref 3.87–5.11)
RDW: 11.7 % (ref 11.5–15.5)
WBC: 6.9 10*3/uL (ref 4.0–10.5)
nRBC: 0 % (ref 0.0–0.2)

## 2020-11-04 LAB — SODIUM, URINE, RANDOM: Sodium, Ur: <10 mmol/L

## 2020-11-04 LAB — OSMOLALITY: Osmolality: 277 mOsm/kg (ref 275–295)

## 2020-11-04 LAB — BRAIN NATRIURETIC PEPTIDE: B Natriuretic Peptide: 243.3 pg/mL — ABNORMAL HIGH (ref 0.0–100.0)

## 2020-11-04 LAB — SODIUM
Sodium: 131 mmol/L — ABNORMAL LOW (ref 135–145)
Sodium: 135 mmol/L (ref 135–145)
Sodium: 137 mmol/L (ref 135–145)

## 2020-11-04 LAB — OSMOLALITY, URINE: Osmolality, Ur: 68 mOsm/kg — ABNORMAL LOW (ref 300–900)

## 2020-11-04 LAB — MAGNESIUM: Magnesium: 2 mg/dL (ref 1.7–2.4)

## 2020-11-04 LAB — HIV ANTIBODY (ROUTINE TESTING W REFLEX): HIV Screen 4th Generation wRfx: NONREACTIVE

## 2020-11-04 MED ORDER — ADULT MULTIVITAMIN W/MINERALS CH
1.0000 | ORAL_TABLET | Freq: Every day | ORAL | Status: DC
  Administered 2020-11-05: 1 via ORAL
  Filled 2020-11-04: qty 1

## 2020-11-04 MED ORDER — DEXTROSE 5 % IV SOLN: INTRAVENOUS | Status: AC

## 2020-11-04 MED ORDER — DONEPEZIL HCL 5 MG PO TABS
10.0000 mg | ORAL_TABLET | Freq: Every day | ORAL | Status: DC
  Administered 2020-11-04: 10 mg via ORAL
  Filled 2020-11-04 (×2): qty 2

## 2020-11-04 MED ORDER — ENSURE ENLIVE PO LIQD
237.0000 mL | Freq: Three times a day (TID) | ORAL | Status: DC
  Administered 2020-11-04 – 2020-11-05 (×2): 237 mL via ORAL

## 2020-11-04 MED ORDER — ALBUTEROL SULFATE HFA 108 (90 BASE) MCG/ACT IN AERS
1.0000 | INHALATION_SPRAY | Freq: Four times a day (QID) | RESPIRATORY_TRACT | Status: DC | PRN
  Filled 2020-11-04: qty 6.7

## 2020-11-04 MED ORDER — DIAZEPAM 5 MG PO TABS
5.0000 mg | ORAL_TABLET | Freq: Two times a day (BID) | ORAL | Status: DC
  Administered 2020-11-04 – 2020-11-05 (×2): 5 mg via ORAL
  Filled 2020-11-04 (×3): qty 1

## 2020-11-04 MED ORDER — MIRTAZAPINE 15 MG PO TABS: 15.0000 mg | ORAL_TABLET | Freq: Every day | ORAL | Status: DC

## 2020-11-04 MED ORDER — ATORVASTATIN CALCIUM 20 MG PO TABS
20.0000 mg | ORAL_TABLET | Freq: Every evening | ORAL | Status: DC
  Administered 2020-11-04: 20 mg via ORAL

## 2020-11-04 MED ORDER — DESMOPRESSIN ACETATE 4 MCG/ML IJ SOLN
2.0000 ug | Freq: Once | INTRAMUSCULAR | Status: AC
  Administered 2020-11-04: 2 ug via INTRAVENOUS
  Filled 2020-11-04: qty 1

## 2020-11-04 MED ORDER — MEMANTINE HCL 5 MG PO TABS
5.0000 mg | ORAL_TABLET | Freq: Two times a day (BID) | ORAL | Status: DC
  Administered 2020-11-04 – 2020-11-05 (×2): 5 mg via ORAL
  Filled 2020-11-04 (×3): qty 1

## 2020-11-04 MED ORDER — POTASSIUM CHLORIDE 20 MEQ PO PACK
40.0000 meq | PACK | Freq: Once | ORAL | Status: AC
  Administered 2020-11-04: 40 meq via ORAL
  Filled 2020-11-04: qty 2

## 2020-11-04 NOTE — ED Notes (Signed)
Patients SPO2 noted to be in mid to high 80's on room air. Patient placed on 2L O2.  BP noted to be soft despite repositioning of BP cuff and patient. Prudy Feeler, MD messaged via secure chat about these findings.

## 2020-11-04 NOTE — Progress Notes (Signed)
Initial Nutrition Assessment  DOCUMENTATION CODES:   Non-severe (moderate) malnutrition in context of social or environmental circumstances  INTERVENTION:   Ensure Enlive po TID, each supplement provides 350 kcal and 20 grams of protein  Magic cup TID with meals, each supplement provides 290 kcal and 9 grams of protein  MVI po daily   Pt at high refeed risk; recommend monitor potassium, magnesium and phosphorus labs daily until stable  NUTRITION DIAGNOSIS:   Moderate Malnutrition related to social / environmental circumstances (poor oral intake, cognitive impairment) as evidenced by mild fat depletion,moderate fat depletion,moderate muscle depletion,severe muscle depletion.  GOAL:   Patient will meet greater than or equal to 90% of their needs  MONITOR:   PO intake,Supplement acceptance,Labs,Weight trends,Skin,I & O's  REASON FOR ASSESSMENT:   Malnutrition Screening Tool    ASSESSMENT:   70 y.o. female with medical history significant for hypertension, cognitive impairment, pulmonary nodules, unintentional weight loss and tobacco use disorder who was brought to the hospital because of confusion.   Met with pt in room today. Pt is a poor historian and is only able to provide limited nutrition related history. Pt reports decreased appetite and oral intake at baseline. Pt reports that she has never been a big eater but that recently her appetite has become poor. Pt is unable to tell RD why her appetite is poor. Pt does report that she has been drinking vanilla Boost at home. Pt reports that she does not like the hospital food; pt reports eating some pudding and jello today. Per chart, pt is down 43lbs(27%) since 05/2020; pt reports that she is unsure of how much weight she has lost or over what period of time. RD will add supplements and MVI to help pt meet her estimated needs. Pt is likely at refeed risk.   Medications reviewed and include: lovenox  Labs reviewed: K  3.3(L)  NUTRITION - FOCUSED PHYSICAL EXAM:  Flowsheet Row Most Recent Value  Orbital Region Mild depletion  Upper Arm Region Moderate depletion  Thoracic and Lumbar Region Mild depletion  Buccal Region Mild depletion  Temple Region Mild depletion  Clavicle Bone Region Moderate depletion  Clavicle and Acromion Bone Region Moderate depletion  Scapular Bone Region Mild depletion  Dorsal Hand Mild depletion  Patellar Region Severe depletion  Anterior Thigh Region Severe depletion  Posterior Calf Region Severe depletion  Edema (RD Assessment) None  Hair Reviewed  Eyes Reviewed  Mouth Reviewed  Skin Reviewed  Nails Reviewed     Diet Order:   Diet Order            Diet regular Room service appropriate? Yes; Fluid consistency: Thin  Diet effective now                EDUCATION NEEDS:   Education needs have been addressed  Skin:  Skin Assessment: Reviewed RN Assessment  Last BM:  pta  Height:   Ht Readings from Last 1 Encounters:  11/04/20 '5\' 3"'  (1.6 m)    Weight:   Wt Readings from Last 1 Encounters:  11/04/20 52.8 kg    Ideal Body Weight:  52.3 kg  BMI:  Body mass index is 20.6 kg/m.  Estimated Nutritional Needs:   Kcal:  1400-1600kcal/day  Protein:  70-80g/day  Fluid:  1.4-1.6L/day  Koleen Distance MS, RD, LDN Please refer to [facility] for RD and/or RD on-call/weekend/after hours pager

## 2020-11-04 NOTE — ED Notes (Addendum)
Son at bedside. Pt asked this nurse again this nurse's name upon this nurse entering room. Oxygen reapplied, 2L per .

## 2020-11-04 NOTE — ED Notes (Signed)
Tele sitter given report and confirmed online and patient is in sight.

## 2020-11-04 NOTE — ED Notes (Signed)
Attending notified of BMP result.

## 2020-11-04 NOTE — Progress Notes (Addendum)
Progress Note    Casey Lara  OVZ:[id number] DOB: Sep 06, 1950  DOA: 11/03/2020 PCP: Glean Hess, MD      Brief Narrative:    Medical records reviewed and are as summarized below:  Casey Lara is a 70 y.o. female with medical history significant for hypertension, cognitive impairment, pulmonary nodules, unintentional weight loss, tobacco use disorder, was brought to the hospital because of confusion.  She was recently seen by her PCP because of somnolence and weight loss.  She was prescribed mirtazapine to stimulate her appetite and was advised to drink lots of water.  On presentation to the ED, she was found to have acute hyponatremia, hypokalemia and metabolic encephalopathy.  She was treated with IV normal saline and potassium was repleted.  Unfortunately, sodium level corrected rapidly from 1 19-1 35.  She was therefore given IV dextrose infusion and desmopressin in an attempt to slow down the correction.      Assessment/Plan:   Principal Problem:   Hyponatremia, acute Active Problems:   History of malignant melanoma of skin   Essential (primary) hypertension   Nicotine dependence   Memory difficulties   Hypokalemia   Unintentional weight loss   Pulmonary nodules   Medication adverse effect   Acute metabolic encephalopathy   Hypomagnesemia    Body mass index is 20.6 kg/m.    Hyponatremia: Sodium level has improved.  There was inadvertent rapid correction of sodium level so she was given IV dextrose infusion and desmopressin in an attempt to slow down the correction.  Continue to monitor sodium level.  She was taking bisoprolol-hydrochlorothiazide for hypertension and mirtazapine to stimulate appetite.  She has been advised that these medications will be discontinued.  Repeat BMP tomorrow.  Plan discussed with nephrologist, Dr. Candiss Norse.  Hypokalemia: Continue potassium repletion and monitor levels.  Hypomagnesemia: Improved  Acute metabolic  encephalopathy: Improved  Hypertension: BP is soft so hold all antihypertensives.  Dementia: Continue Aricept and Namenda  Unintentional weight loss, pulmonary nodules: Outpatient follow-up with pulmonologist.  Discontinue mirtazapine  Tobacco use disorder: Continue nicotine patch   Diet Order            Diet regular Room service appropriate? Yes; Fluid consistency: Thin  Diet effective now                    Consultants:  Nephrologist  Procedures:  None    Medications:   . atorvastatin  20 mg Oral QPM  . diazepam  5 mg Oral BID  . donepezil  10 mg Oral QHS  . enoxaparin (LOVENOX) injection  40 mg Subcutaneous Q24H  . memantine  5 mg Oral BID  . sodium chloride flush  3 mL Intravenous Once   Continuous Infusions:   Anti-infectives (From admission, onward)   None             Family Communication/Anticipated D/C date and plan/Code Status   DVT prophylaxis: enoxaparin (LOVENOX) injection 40 mg Start: 11/03/20 2200     Code Status: Full Code  Family Communication: None Disposition Plan:    Status is: Inpatient  Remains inpatient appropriate because:Inpatient level of care appropriate due to severity of illness   Dispo: The patient is from: Home              Anticipated d/c is to: Home              Patient currently is not medically stable to d/c.   Difficult to place  patient No           Subjective:   Interval events noted.  No nausea, vomiting, headache or confusion.  Objective:    Vitals:   11/04/20 1315 11/04/20 1330 11/04/20 1444 11/04/20 1446  BP:    131/75  Pulse: 69 74  66  Resp: (!) 23 (!) 30  16  Temp:    98.2 F (36.8 C)  TempSrc:      SpO2: 93% 94%  (!) 86%  Weight:   52.8 kg   Height:   5\' 3"  (1.6 m)    No data found.   Intake/Output Summary (Last 24 hours) at 11/04/2020 1455 Last data filed at 11/04/2020 1418 Gross per 24 hour  Intake 240 ml  Output 2600 ml  Net -2360 ml   Filed Weights    11/03/20 1936 11/04/20 1444  Weight: 53.1 kg 52.8 kg    Exam:  GEN: NAD SKIN: No rash EYES: EOMI ENT: MMM CV: RRR PULM: CTA B ABD: soft, ND, NT, +BS CNS: AAO x 3, non focal EXT: No edema or tenderness        Data Reviewed:   I have personally reviewed following labs and imaging studies:  Labs: Labs show the following:   Basic Metabolic Panel: Recent Labs  Lab 10/29/20 1644 11/03/20 1942 11/03/20 2147 11/04/20 0116 11/04/20 0327 11/04/20 0533  NA 142 119* 123* 131* 135 135  K 3.2* 2.4*  --   --   --  3.3*  CL 102 86*  --   --   --  105  CO2 22 19*  --   --   --  24  GLUCOSE 103* 136*  --   --   --  135*  BUN 30* 14  --   --   --  9  CREATININE 1.09* 0.63  --   --   --  0.79  CALCIUM 9.8 8.2*  --   --   --  8.3*  MG  --  1.2*  --   --  2.0  --    GFR Estimated Creatinine Clearance: 54.9 mL/min (by C-G formula based on SCr of 0.79 mg/dL). Liver Function Tests: Recent Labs  Lab 10/29/20 1644 11/03/20 1942  AST 37 36  ALT 42* 35  ALKPHOS 69 51  BILITOT 0.7 1.2  PROT 6.8 7.1  ALBUMIN 4.2 3.9   No results for input(s): LIPASE, AMYLASE in the last 168 hours. No results for input(s): AMMONIA in the last 168 hours. Coagulation profile Recent Labs  Lab 11/03/20 1942  INR 1.0    CBC: Recent Labs  Lab 10/29/20 1644 11/03/20 1942 11/04/20 0631  WBC 9.6 11.7* 6.9  NEUTROABS 6.7 9.2*  --   HGB 16.6* 14.1 14.3  HCT 48.6* 38.6 38.8  MCV 86 81.8 81.5  PLT 196 184 174   Cardiac Enzymes: No results for input(s): CKTOTAL, CKMB, CKMBINDEX, TROPONINI in the last 168 hours. BNP (last 3 results) No results for input(s): PROBNP in the last 8760 hours. CBG: Recent Labs  Lab 11/03/20 1936  GLUCAP 154*   D-Dimer: No results for input(s): DDIMER in the last 72 hours. Hgb A1c: No results for input(s): HGBA1C in the last 72 hours. Lipid Profile: No results for input(s): CHOL, HDL, LDLCALC, TRIG, CHOLHDL, LDLDIRECT in the last 72 hours. Thyroid  function studies: No results for input(s): TSH, T4TOTAL, T3FREE, THYROIDAB in the last 72 hours.  Invalid input(s): FREET3 Anemia work up: No results for input(s):  VITAMINB12, FOLATE, FERRITIN, TIBC, IRON, RETICCTPCT in the last 72 hours. Sepsis Labs: Recent Labs  Lab 10/29/20 1644 11/03/20 1942 11/04/20 0631  WBC 9.6 11.7* 6.9    Microbiology Recent Results (from the past 240 hour(s))  Resp Panel by RT-PCR (Flu A&B, Covid) Nasopharyngeal Swab     Status: None   Collection Time: 11/03/20  8:55 PM   Specimen: Nasopharyngeal Swab; Nasopharyngeal(NP) swabs in vial transport medium  Result Value Ref Range Status   SARS Coronavirus 2 by RT PCR NEGATIVE NEGATIVE Final    Comment: (NOTE) SARS-CoV-2 target nucleic acids are NOT DETECTED.  The SARS-CoV-2 RNA is generally detectable in upper respiratory specimens during the acute phase of infection. The lowest concentration of SARS-CoV-2 viral copies this assay can detect is 138 copies/mL. A negative result does not preclude SARS-Cov-2 infection and should not be used as the sole basis for treatment or other patient management decisions. A negative result may occur with  improper specimen collection/handling, submission of specimen other than nasopharyngeal swab, presence of viral mutation(s) within the areas targeted by this assay, and inadequate number of viral copies(<138 copies/mL). A negative result must be combined with clinical observations, patient history, and epidemiological information. The expected result is Negative.  Fact Sheet for Patients:  EntrepreneurPulse.com.au  Fact Sheet for Healthcare Providers:  IncredibleEmployment.be  This test is no t yet approved or cleared by the [place] FDA and  has been authorized for detection and/or diagnosis of SARS-CoV-2 by FDA under an Emergency Use Authorization (EUA). This EUA will remain  in effect (meaning this test can be used) for  the duration of the COVID-19 declaration under Section 564(b)(1) of the Act, 21 U.S.C.section 360bbb-3(b)(1), unless the authorization is terminated  or revoked sooner.       Influenza A by PCR NEGATIVE NEGATIVE Final   Influenza B by PCR NEGATIVE NEGATIVE Final    Comment: (NOTE) The Xpert Xpress SARS-CoV-2/FLU/RSV plus assay is intended as an aid in the diagnosis of influenza from Nasopharyngeal swab specimens and should not be used as a sole basis for treatment. Nasal washings and aspirates are unacceptable for Xpert Xpress SARS-CoV-2/FLU/RSV testing.  Fact Sheet for Patients: EntrepreneurPulse.com.au  Fact Sheet for Healthcare Providers: IncredibleEmployment.be  This test is not yet approved or cleared by the [place] FDA and has been authorized for detection and/or diagnosis of SARS-CoV-2 by FDA under an Emergency Use Authorization (EUA). This EUA will remain in effect (meaning this test can be used) for the duration of the COVID-19 declaration under Section 564(b)(1) of the Act, 21 U.S.C. section 360bbb-3(b)(1), unless the authorization is terminated or revoked.  Performed at [facility], [address] [place],  [address]     Procedures and diagnostic studies:  CT HEAD WO CONTRAST  Result Date: 11/03/2020 CLINICAL DATA:  Headache and confusion. Started new medications 2 days ago. EXAM: CT HEAD WITHOUT CONTRAST TECHNIQUE: Contiguous axial images were obtained from the base of the skull through the vertex without intravenous contrast. COMPARISON:  None. FINDINGS: Brain: No evidence of acute infarction, hemorrhage, hydrocephalus, extra-axial collection or mass lesion/mass effect. Mild cerebral atrophy. Patchy low-attenuation changes in the deep white matter suggesting small vessel ischemic change. Vascular: Intracranial arterial vascular calcifications are present. Skull: Calvarium appears intact. Sinuses/Orbits:  Paranasal sinuses and mastoid air cells are clear. Other: None. IMPRESSION: 1. No acute intracranial abnormalities. 2. Mild cerebral atrophy and small vessel ischemic changes. Electronically Signed   By: Lucienne Capers M.D.   On: 11/03/2020  20:06   DG Chest Port 1 View  Result Date: 11/04/2020 CLINICAL DATA:  70 year old female with hypoxia. Recent altered mental status. EXAM: PORTABLE CHEST 1 VIEW COMPARISON:  Portable chest 11/03/2020 and earlier. FINDINGS: Portable AP upright view at 0557 hours. Larger lung volumes, underlying chronic pulmonary hyperinflation suspected. Stable cardiac size and mediastinal contours. Calcified granuloma right mid lung. Mild chronic increased interstitial markings otherwise. No pneumothorax, pulmonary edema, pleural effusion or acute pulmonary opacity. Visualized tracheal air column is within normal limits. No acute osseous abnormality identified. Negative visible bowel gas pattern. IMPRESSION: Stable chronic lung disease.  No acute cardiopulmonary abnormality. Electronically Signed   By: Genevie Ann M.D.   On: 11/04/2020 06:25   DG Chest Portable 1 View  Result Date: 11/03/2020 CLINICAL DATA:  Altered mental status EXAM: PORTABLE CHEST 1 VIEW COMPARISON:  06/19/2016 FINDINGS: There is hyperinflation of the lungs compatible with COPD. Heart is normal size. Bibasilar scarring or atelectasis. No effusions. No acute bony abnormality. IMPRESSION: COPD.  Bibasilar atelectasis or scarring. Electronically Signed   By: Rolm Baptise M.D.   On: 11/03/2020 20:56               LOS: 1 day   Xiara Knisley  [facility]   Pager on [email or web address]. If 7PM-7AM, please contact night-coverage at [email or web address]     11/04/2020, 2:55 PM

## 2020-11-04 NOTE — ED Notes (Signed)
Went in room to see pt, got bedside report from [place], Therapist, sports. Pt is Alert and awake.

## 2020-11-04 NOTE — ED Notes (Signed)
Requested privacy from [employer], patient cleaned of incontinent urine. Dry pads, brief, and purewick placed. Patient cleaned with wipes with assistance from this RN.  Given warm blankets and water, HOB >30 degrees.   Tele sitter remains bedside for patient safety (seizure precautions); tele sitter called to turn camera back on post patient care, view of patient confirmed. Protocol seizure precautions remain in place.

## 2020-11-04 NOTE — ED Notes (Signed)
Casey Lara, SN and this RN to administer medications. Pt is still A&Ox4 and NAD. Pt noted to have repetitive speech and asking the same questions that she has previously asked moments before.

## 2020-11-04 NOTE — ED Notes (Signed)
Report received from [place], [place] at bedside. Pt is now alert and oriented. Seizure pads and telesitter in place at this time. Repositioned in the bed. No further needs expressed.

## 2020-11-04 NOTE — ED Notes (Signed)
Tele-sitter in place for this patient due to safety concern of pulling at lines as well as getting out of bed without assistance. Pt unable to provide consent for safety.

## 2020-11-04 NOTE — Consult Note (Signed)
[facility] Consult Note:    Date of Admission:  11/03/2020           Reason for Consult:  Hyponatremia   Referring Provider: Athena Masse, MD Primary Care Provider: Glean Hess, MD   History of Presenting Illness:  Casey Lara is a 70 y.o. female  Patient was brought in to the emergency room by her son who found the patient in her garage confused which was unusual for her. Patient has dementia and is limited in providing detailed information.  Patient has history of 40 pound weight loss in the last year.  She was recently started on Remeron by PCP.  Patient was drinking large amounts of water for suspected dehydration. In the emergency room she was found to have low sodium of 119 On March 29, her sodium was 142 therefore given acute situation, she was given IV hydration which resulted in fast correction of her sodium to 135 this morning Nephrology consult has not been requested to evaluate Patient denies any acute complaints No difficulty with swallowing No headaches, shortness of breath or leg edema   Review of Systems: ROS  Review of system is limited due to dementia Patient denies any fevers, chills, headaches, vision problems, hearing issues, shortness of breath, chest pain, thyroid problems, nausea vomiting or diarrhea, difficulty urination, blood in the urine, joint pains or swelling  Past Medical History:  Diagnosis Date  . Cancer ([place])    melanoma  . Hypercholesteremia   . Hypertension     Social History   Tobacco Use  . Smoking status: Current Every Day Smoker    Packs/day: 1.00    Years: 50.00    Pack years: 50.00    Types: Cigarettes    Start date: 08/03/1966  . Smokeless tobacco: Never Used  . Tobacco comment: smoking since age 88  1 pack daily   Vaping Use  . Vaping Use: Never used  Substance Use Topics  . Alcohol use: No    Alcohol/week: 0.0 standard drinks  . Drug use: No    Family History  Problem Relation  Age of Onset  . Stroke Mother   . Breast cancer Mother 15  . Cancer Father   . Breast cancer Maternal Grandmother   . Breast cancer Paternal Grandmother   . Breast cancer Sister 66  . Breast cancer Cousin        mat cousin     OBJECTIVE: Blood pressure 131/75, pulse 66, temperature 98.2 F (36.8 C), resp. rate 16, height 5\' 3"  (1.6 m), weight 52.8 kg, SpO2 (!) 86 %.   Physical Exam: General:  No acute distress, laying in the bed  HEENT  anicteric, moist oral mucous membrane  Pulm/lungs  normal breathing effort, lungs are clear to auscultation  CVS/Heart  regular rhythm, no rub or gallop  Abdomen:   Soft, nontender  Extremities:  No peripheral edema  Neurologic:  Alert, oriented, able to follow commands, good strength in all 4 extremities  Skin:  No acute rashes     Lab Results Lab Results  Component Value Date   WBC 6.9 11/04/2020   HGB 14.3 11/04/2020   HCT 38.8 11/04/2020   MCV 81.5 11/04/2020   PLT 174 11/04/2020    Lab Results  Component Value Date   CREATININE 0.79 11/04/2020   BUN 9 11/04/2020   NA 135 11/04/2020   K 3.3 (L) 11/04/2020   CL 105 11/04/2020   CO2 24 11/04/2020  Lab Results  Component Value Date   ALT 35 11/03/2020   AST 36 11/03/2020   ALKPHOS 51 11/03/2020   BILITOT 1.2 11/03/2020     Microbiology: Recent Results (from the past 240 hour(s))  Resp Panel by RT-PCR (Flu A&B, Covid) Nasopharyngeal Swab     Status: None   Collection Time: 11/03/20  8:55 PM   Specimen: Nasopharyngeal Swab; Nasopharyngeal(NP) swabs in vial transport medium  Result Value Ref Range Status   SARS Coronavirus 2 by RT PCR NEGATIVE NEGATIVE Final    Comment: (NOTE) SARS-CoV-2 target nucleic acids are NOT DETECTED.  The SARS-CoV-2 RNA is generally detectable in upper respiratory specimens during the acute phase of infection. The lowest concentration of SARS-CoV-2 viral copies this assay can detect is 138 copies/mL. A negative result does not preclude  SARS-Cov-2 infection and should not be used as the sole basis for treatment or other patient management decisions. A negative result may occur with  improper specimen collection/handling, submission of specimen other than nasopharyngeal swab, presence of viral mutation(s) within the areas targeted by this assay, and inadequate number of viral copies(<138 copies/mL). A negative result must be combined with clinical observations, patient history, and epidemiological information. The expected result is Negative.  Fact Sheet for Patients:  EntrepreneurPulse.com.au  Fact Sheet for Healthcare Providers:  IncredibleEmployment.be  This test is no t yet approved or cleared by the [place] FDA and  has been authorized for detection and/or diagnosis of SARS-CoV-2 by FDA under an Emergency Use Authorization (EUA). This EUA will remain  in effect (meaning this test can be used) for the duration of the COVID-19 declaration under Section 564(b)(1) of the Act, 21 U.S.C.section 360bbb-3(b)(1), unless the authorization is terminated  or revoked sooner.       Influenza A by PCR NEGATIVE NEGATIVE Final   Influenza B by PCR NEGATIVE NEGATIVE Final    Comment: (NOTE) The Xpert Xpress SARS-CoV-2/FLU/RSV plus assay is intended as an aid in the diagnosis of influenza from Nasopharyngeal swab specimens and should not be used as a sole basis for treatment. Nasal washings and aspirates are unacceptable for Xpert Xpress SARS-CoV-2/FLU/RSV testing.  Fact Sheet for Patients: EntrepreneurPulse.com.au  Fact Sheet for Healthcare Providers: IncredibleEmployment.be  This test is not yet approved or cleared by the [place] FDA and has been authorized for detection and/or diagnosis of SARS-CoV-2 by FDA under an Emergency Use Authorization (EUA). This EUA will remain in effect (meaning this test can be used) for the duration of  the COVID-19 declaration under Section 564(b)(1) of the Act, 21 U.S.C. section 360bbb-3(b)(1), unless the authorization is terminated or revoked.  Performed at [facility], [address] [place], [place] [address]     Medications: Scheduled Meds: . atorvastatin  20 mg Oral QPM  . diazepam  5 mg Oral BID  . donepezil  10 mg Oral QHS  . enoxaparin (LOVENOX) injection  40 mg Subcutaneous Q24H  . memantine  5 mg Oral BID  . sodium chloride flush  3 mL Intravenous Once   Continuous Infusions: PRN Meds:.acetaminophen **OR** acetaminophen, albuterol, ondansetron **OR** ondansetron (ZOFRAN) IV  No Known Allergies  Urinalysis: Recent Labs    11/03/20 1942  COLORURINE COLORLESS*  LABSPEC 1.001*  PHURINE 6.0  GLUCOSEU NEGATIVE  HGBUR NEGATIVE  BILIRUBINUR NEGATIVE  KETONESUR NEGATIVE  PROTEINUR NEGATIVE  NITRITE NEGATIVE  LEUKOCYTESUR NEGATIVE      Imaging: CT HEAD WO CONTRAST  Result Date: 11/03/2020 CLINICAL DATA:  Headache and confusion. Started new  medications 2 days ago. EXAM: CT HEAD WITHOUT CONTRAST TECHNIQUE: Contiguous axial images were obtained from the base of the skull through the vertex without intravenous contrast. COMPARISON:  None. FINDINGS: Brain: No evidence of acute infarction, hemorrhage, hydrocephalus, extra-axial collection or mass lesion/mass effect. Mild cerebral atrophy. Patchy low-attenuation changes in the deep white matter suggesting small vessel ischemic change. Vascular: Intracranial arterial vascular calcifications are present. Skull: Calvarium appears intact. Sinuses/Orbits: Paranasal sinuses and mastoid air cells are clear. Other: None. IMPRESSION: 1. No acute intracranial abnormalities. 2. Mild cerebral atrophy and small vessel ischemic changes. Electronically Signed   By: Lucienne Capers M.D.   On: 11/03/2020 20:06   DG Chest Port 1 View  Result Date: 11/04/2020 CLINICAL DATA:  70 year old female with hypoxia. Recent altered mental  status. EXAM: PORTABLE CHEST 1 VIEW COMPARISON:  Portable chest 11/03/2020 and earlier. FINDINGS: Portable AP upright view at 0557 hours. Larger lung volumes, underlying chronic pulmonary hyperinflation suspected. Stable cardiac size and mediastinal contours. Calcified granuloma right mid lung. Mild chronic increased interstitial markings otherwise. No pneumothorax, pulmonary edema, pleural effusion or acute pulmonary opacity. Visualized tracheal air column is within normal limits. No acute osseous abnormality identified. Negative visible bowel gas pattern. IMPRESSION: Stable chronic lung disease.  No acute cardiopulmonary abnormality. Electronically Signed   By: Genevie Ann M.D.   On: 11/04/2020 06:25   DG Chest Portable 1 View  Result Date: 11/03/2020 CLINICAL DATA:  Altered mental status EXAM: PORTABLE CHEST 1 VIEW COMPARISON:  06/19/2016 FINDINGS: There is hyperinflation of the lungs compatible with COPD. Heart is normal size. Bibasilar scarring or atelectasis. No effusions. No acute bony abnormality. IMPRESSION: COPD.  Bibasilar atelectasis or scarring. Electronically Signed   By: Rolm Baptise M.D.   On: 11/03/2020 20:56      Assessment/Plan:  Sebrina Kessner Cavey is a 70 y.o. female with medical problems of   hypertension, dementia, nicotine dependence, Hyperlipidemia  was admitted on 11/03/2020 for :  Hypokalemia [E87.6] Hyponatremia [E87.1] Hypoxia [R09.02] Altered mental status, unspecified altered mental status type [R41.82]   #Acute hyponatremia Recent sodium level of 142 noted on March 29 Admission sodium of 119 with low potassium of 2.4, low chloride of 86 Fast correction with IV saline administration Most recent sodium of 135 Patient was given IV DDAVP 2 mcg at 7 AM today to prevent further correction We will obtain sodium level for this afternoon   Matson Welch 11/04/20

## 2020-11-04 NOTE — ED Notes (Signed)
Damita Dunnings, MD informed of patient's resulted sodium per request.

## 2020-11-05 LAB — BASIC METABOLIC PANEL
Anion gap: 9 (ref 5–15)
BUN: 7 mg/dL — ABNORMAL LOW (ref 8–23)
CO2: 24 mmol/L (ref 22–32)
Calcium: 8.6 mg/dL — ABNORMAL LOW (ref 8.9–10.3)
Chloride: 105 mmol/L (ref 98–111)
Creatinine, Ser: 0.62 mg/dL (ref 0.44–1.00)
GFR, Estimated: >60 mL/min (ref >60)
Glucose, Bld: 81 mg/dL (ref 70–99)
Potassium: 3.5 mmol/L (ref 3.5–5.1)
Sodium: 138 mmol/L (ref 135–145)

## 2020-11-05 MED ORDER — ADULT MULTIVITAMIN W/MINERALS CH: 1.0000 | ORAL_TABLET | Freq: Every day | ORAL | Status: AC

## 2020-11-05 MED ORDER — BISOPROLOL FUMARATE 10 MG PO TABS: 10.0000 mg | ORAL_TABLET | Freq: Every day | ORAL | 0 refills | Status: DC

## 2020-11-05 NOTE — Progress Notes (Signed)
Patient is being discharge home as per order, discharge instruction provided , patient verbalize understanding of all instruction

## 2020-11-05 NOTE — Plan of Care (Signed)
  Problem: Clinical Measurements: Goal: Ability to maintain clinical measurements within normal limits will improve Outcome: Adequate for Discharge   Problem: Activity: Goal: Risk for activity intolerance will decrease Outcome: Adequate for Discharge   Problem: Education: Goal: Knowledge of General Education information will improve Description: Including pain rating scale, medication(s)/side effects and non-pharmacologic comfort measures Outcome: Adequate for Discharge   Problem: Nutrition: Goal: Adequate nutrition will be maintained Outcome: Adequate for Discharge

## 2020-11-05 NOTE — Progress Notes (Signed)
[facility]  ROUNDING NOTE   Subjective:   Casey Lara is a 70 y.o. female with past medical history of HTN, dementia, and nicotine. She was brought into the ED by her son for confusion. She was recently prescribed Mirtazapine for loss of appetite. She was recently told she may be dehydrated and has increased her water intake.    We have been consulted for hyponatremia. Patient seen resting in bed eating breakfast. She was unable to tell me the place, time or situation. She denies nausea and shortness of breath. Pateint seen later in the morning more alert and oriented.   Objective:  Vital signs in last 24 hours:  Temp:  [98 F (36.7 C)-98.7 F (37.1 C)] 98.4 F (36.9 C) (04/05 1241) Pulse Rate:  [65-86] 86 (04/05 1241) Resp:  [16-18] 16 (04/05 1241) BP: (105-139)/(67-82) 110/67 (04/05 1241) SpO2:  [86 %-98 %] 95 % (04/05 1241) Weight:  [52.8 kg] 52.8 kg (04/04 1444)  Weight change: -0.318 kg Filed Weights   11/03/20 1936 11/04/20 1444  Weight: 53.1 kg 52.8 kg    Intake/Output: I/O last 3 completed shifts: In: 480 [P.O.:480] Out: 2600 [Urine:2600]   Intake/Output this shift:  No intake/output data recorded.  Physical Exam: General: NAD resting in bed  Head: Normocephalic, atraumatic. Moist oral mucosal membranes  Lungs:  Clear to auscultation  Heart: Regular rate and rhythm  Abdomen:  Soft, nontender,   Extremities:  trace peripheral edema.  Neurologic: Nonfocal, moving all four extremities  Skin: No lesions       Basic Metabolic Panel: Recent Labs  Lab 10/29/20 1644 11/03/20 1942 11/03/20 2147 11/04/20 0116 11/04/20 0327 11/04/20 0533 11/04/20 1556 11/05/20 0809  NA 142 119*   < > 131* 135 135 137 138  K 3.2* 2.4*  --   --   --  3.3*  --  3.5  CL 102 86*  --   --   --  105  --  105  CO2 22 19*  --   --   --  24  --  24  GLUCOSE 103* 136*  --   --   --  135*  --  81  BUN 30* 14  --   --   --  9  --  7*  CREATININE 1.09* 0.63  --   --   --   0.79  --  0.62  CALCIUM 9.8 8.2*  --   --   --  8.3*  --  8.6*  MG  --  1.2*  --   --  2.0  --   --   --    < > = values in this interval not displayed.    Liver Function Tests: Recent Labs  Lab 10/29/20 1644 11/03/20 1942  AST 37 36  ALT 42* 35  ALKPHOS 69 51  BILITOT 0.7 1.2  PROT 6.8 7.1  ALBUMIN 4.2 3.9   No results for input(s): LIPASE, AMYLASE in the last 168 hours. No results for input(s): AMMONIA in the last 168 hours.  CBC: Recent Labs  Lab 10/29/20 1644 11/03/20 1942 11/04/20 0631  WBC 9.6 11.7* 6.9  NEUTROABS 6.7 9.2*  --   HGB 16.6* 14.1 14.3  HCT 48.6* 38.6 38.8  MCV 86 81.8 81.5  PLT 196 184 174    Cardiac Enzymes: No results for input(s): CKTOTAL, CKMB, CKMBINDEX, TROPONINI in the last 168 hours.  BNP: Invalid input(s): POCBNP  CBG: Recent Labs  Lab 11/03/20 1936  GLUCAP 154*    Microbiology: Results for orders placed or performed during the hospital encounter of 11/03/20  Resp Panel by RT-PCR (Flu A&B, Covid) Nasopharyngeal Swab     Status: None   Collection Time: 11/03/20  8:55 PM   Specimen: Nasopharyngeal Swab; Nasopharyngeal(NP) swabs in vial transport medium  Result Value Ref Range Status   SARS Coronavirus 2 by RT PCR NEGATIVE NEGATIVE Final    Comment: (NOTE) SARS-CoV-2 target nucleic acids are NOT DETECTED.  The SARS-CoV-2 RNA is generally detectable in upper respiratory specimens during the acute phase of infection. The lowest concentration of SARS-CoV-2 viral copies this assay can detect is 138 copies/mL. A negative result does not preclude SARS-Cov-2 infection and should not be used as the sole basis for treatment or other patient management decisions. A negative result may occur with  improper specimen collection/handling, submission of specimen other than nasopharyngeal swab, presence of viral mutation(s) within the areas targeted by this assay, and inadequate number of viral copies(<138 copies/mL). A negative result  must be combined with clinical observations, patient history, and epidemiological information. The expected result is Negative.  Fact Sheet for Patients:  EntrepreneurPulse.com.au  Fact Sheet for Healthcare Providers:  IncredibleEmployment.be  This test is no t yet approved or cleared by the [place] FDA and  has been authorized for detection and/or diagnosis of SARS-CoV-2 by FDA under an Emergency Use Authorization (EUA). This EUA will remain  in effect (meaning this test can be used) for the duration of the COVID-19 declaration under Section 564(b)(1) of the Act, 21 U.S.C.section 360bbb-3(b)(1), unless the authorization is terminated  or revoked sooner.       Influenza A by PCR NEGATIVE NEGATIVE Final   Influenza B by PCR NEGATIVE NEGATIVE Final    Comment: (NOTE) The Xpert Xpress SARS-CoV-2/FLU/RSV plus assay is intended as an aid in the diagnosis of influenza from Nasopharyngeal swab specimens and should not be used as a sole basis for treatment. Nasal washings and aspirates are unacceptable for Xpert Xpress SARS-CoV-2/FLU/RSV testing.  Fact Sheet for Patients: EntrepreneurPulse.com.au  Fact Sheet for Healthcare Providers: IncredibleEmployment.be  This test is not yet approved or cleared by the [place] FDA and has been authorized for detection and/or diagnosis of SARS-CoV-2 by FDA under an Emergency Use Authorization (EUA). This EUA will remain in effect (meaning this test can be used) for the duration of the COVID-19 declaration under Section 564(b)(1) of the Act, 21 U.S.C. section 360bbb-3(b)(1), unless the authorization is terminated or revoked.  Performed at [facility], [address] [place], [place] [address]     Coagulation Studies: Recent Labs    11/03/20 1942  LABPROT 13.0  INR 1.0    Urinalysis: Recent Labs    11/03/20 1942  COLORURINE COLORLESS*   LABSPEC 1.001*  PHURINE 6.0  GLUCOSEU NEGATIVE  HGBUR NEGATIVE  BILIRUBINUR NEGATIVE  KETONESUR NEGATIVE  PROTEINUR NEGATIVE  NITRITE NEGATIVE  LEUKOCYTESUR NEGATIVE      Imaging: CT HEAD WO CONTRAST  Result Date: 11/03/2020 CLINICAL DATA:  Headache and confusion. Started new medications 2 days ago. EXAM: CT HEAD WITHOUT CONTRAST TECHNIQUE: Contiguous axial images were obtained from the base of the skull through the vertex without intravenous contrast. COMPARISON:  None. FINDINGS: Brain: No evidence of acute infarction, hemorrhage, hydrocephalus, extra-axial collection or mass lesion/mass effect. Mild cerebral atrophy. Patchy low-attenuation changes in the deep white matter suggesting small vessel ischemic change. Vascular: Intracranial arterial vascular calcifications are present. Skull: Calvarium appears intact. Sinuses/Orbits: Paranasal sinuses  and mastoid air cells are clear. Other: None. IMPRESSION: 1. No acute intracranial abnormalities. 2. Mild cerebral atrophy and small vessel ischemic changes. Electronically Signed   By: Lucienne Capers M.D.   On: 11/03/2020 20:06   DG Chest Port 1 View  Result Date: 11/04/2020 CLINICAL DATA:  70 year old female with hypoxia. Recent altered mental status. EXAM: PORTABLE CHEST 1 VIEW COMPARISON:  Portable chest 11/03/2020 and earlier. FINDINGS: Portable AP upright view at 0557 hours. Larger lung volumes, underlying chronic pulmonary hyperinflation suspected. Stable cardiac size and mediastinal contours. Calcified granuloma right mid lung. Mild chronic increased interstitial markings otherwise. No pneumothorax, pulmonary edema, pleural effusion or acute pulmonary opacity. Visualized tracheal air column is within normal limits. No acute osseous abnormality identified. Negative visible bowel gas pattern. IMPRESSION: Stable chronic lung disease.  No acute cardiopulmonary abnormality. Electronically Signed   By: Genevie Ann M.D.   On: 11/04/2020 06:25   DG Chest  Portable 1 View  Result Date: 11/03/2020 CLINICAL DATA:  Altered mental status EXAM: PORTABLE CHEST 1 VIEW COMPARISON:  06/19/2016 FINDINGS: There is hyperinflation of the lungs compatible with COPD. Heart is normal size. Bibasilar scarring or atelectasis. No effusions. No acute bony abnormality. IMPRESSION: COPD.  Bibasilar atelectasis or scarring. Electronically Signed   By: Rolm Baptise M.D.   On: 11/03/2020 20:56     Medications:    . atorvastatin  20 mg Oral QPM  . diazepam  5 mg Oral BID  . donepezil  10 mg Oral QHS  . enoxaparin (LOVENOX) injection  40 mg Subcutaneous Q24H  . feeding supplement  237 mL Oral TID BM  . memantine  5 mg Oral BID  . multivitamin with minerals  1 tablet Oral Daily  . sodium chloride flush  3 mL Intravenous Once   acetaminophen **OR** acetaminophen, albuterol, ondansetron **OR** ondansetron [facility]) IV  Assessment/ Plan:  Ms. Tessy Pawelski is a 71 y.o.  female with past medical history of HTN, dementia, and nicotine.  1. Hyponatremia on admission 119 Likely due to medication and polydipsia Given 2L NS bolus in ED CT head with no abnormalities Given DDAVP yesterday to slow correction Current level 137 Encouraged oral nutrition Sustain from new medications for 2 weeks  2. Hypokalemia on admission 2.4 Corrected with nutrition and re-hydration  3. Altered mental status - likely due to electrolyte imbalances - Ment[place] corrected with electrolytes      LOS: 2   4/5/20221:35 PM

## 2020-11-05 NOTE — Discharge Summary (Addendum)
Physician Discharge Summary  Casey Lara [facility] QTM:[id number] DOB: January 02, 1951 DOA: 11/03/2020  PCP: Glean Hess, MD  Admit date: 11/03/2020 Discharge date: 11/05/2020  Discharge disposition: Home   Recommendations for Outpatient Follow-Up:   Follow-up with PCP in 1 week  Discharge Diagnosis:   Principal Problem:   Hyponatremia, acute Active Problems:   History of malignant melanoma of skin   Essential (primary) hypertension   Nicotine dependence   Memory difficulties   Hypokalemia   Unintentional weight loss   Pulmonary nodules   Medication adverse effect   Acute metabolic encephalopathy   Hypomagnesemia    Discharge Condition: Stable.  Diet recommendation:  Diet Order            Diet - low sodium heart healthy           Diet regular Room service appropriate? Yes; Fluid consistency: Thin  Diet effective now                   Code Status: Full Code     Hospital Course:   Ms. Casey Lara is a 70 y.o. female with medical history significant for hypertension, cognitive impairment, pulmonary nodules, unintentional weight loss, tobacco use disorder, who was brought to the hospital because of confusion.  She was recently seen by her PCP because of somnolence and weight loss.  She was prescribed mirtazapine to stimulate her appetite and was advised to drink lots of water.  On presentation to the ED, she was found to have acute hyponatremia, hypokalemia and metabolic encephalopathy.  She was treated with IV normal saline and potassium was repleted.  Unfortunately, sodium level corrected rapidly from 119-135.  She was therefore given IV dextrose infusion and desmopressin in an attempt to reverse the correction.  She also had hypokalemia and hypomagnesemia that were repleted.  Her mental status is back to baseline and sodium level has normalized.  She has been advised to discontinue mirtazapine because of hyponatremia.  She was taking  bisoprolol-hydrochlorothiazide.  Bisoprolol has been substituted for bisoprolol-hydrochlorothiazide tablet because of hyponatremia.  She is deemed stable for discharge to home today.  Discharge plan was discussed with her 2 sons Legrand [place] and [place]) at the bedside.    Medical Consultants:    Nephrologist   Discharge Exam:    Vitals:   11/04/20 1541 11/04/20 2113 11/05/20 0520 11/05/20 0731  BP: 105/80 139/74 131/82 132/78  Pulse: 68 84 75 65  Resp: 16 18 17 16   Temp: 98.7 F (37.1 C) 98.4 F (36.9 C) 98.1 F (36.7 C) 98 F (36.7 C)  TempSrc: Oral Oral Oral Oral  SpO2: 98% 98% 95% 97%  Weight:      Height:         GEN: NAD SKIN: No rash EYES: EOMI ENT: MMM CV: RRR PULM: CTA B ABD: soft, ND, NT, +BS CNS: AAO x 3, non focal EXT: No edema or tenderness   The results of significant diagnostics from this hospitalization (including imaging, microbiology, ancillary and laboratory) are listed below for reference.     Procedures and Diagnostic Studies:   CT HEAD WO CONTRAST  Result Date: 11/03/2020 CLINICAL DATA:  Headache and confusion. Started new medications 2 days ago. EXAM: CT HEAD WITHOUT CONTRAST TECHNIQUE: Contiguous axial images were obtained from the base of the skull through the vertex without intravenous contrast. COMPARISON:  None. FINDINGS: Brain: No evidence of acute infarction, hemorrhage, hydrocephalus, extra-axial collection or mass lesion/mass effect. Mild cerebral atrophy. Patchy low-attenuation changes in  the deep white matter suggesting small vessel ischemic change. Vascular: Intracranial arterial vascular calcifications are present. Skull: Calvarium appears intact. Sinuses/Orbits: Paranasal sinuses and mastoid air cells are clear. Other: None. IMPRESSION: 1. No acute intracranial abnormalities. 2. Mild cerebral atrophy and small vessel ischemic changes. Electronically Signed   By: Lucienne Capers M.D.   On: 11/03/2020 20:06   DG Chest Port 1  View  Result Date: 11/04/2020 CLINICAL DATA:  70 year old female with hypoxia. Recent altered mental status. EXAM: PORTABLE CHEST 1 VIEW COMPARISON:  Portable chest 11/03/2020 and earlier. FINDINGS: Portable AP upright view at 0557 hours. Larger lung volumes, underlying chronic pulmonary hyperinflation suspected. Stable cardiac size and mediastinal contours. Calcified granuloma right mid lung. Mild chronic increased interstitial markings otherwise. No pneumothorax, pulmonary edema, pleural effusion or acute pulmonary opacity. Visualized tracheal air column is within normal limits. No acute osseous abnormality identified. Negative visible bowel gas pattern. IMPRESSION: Stable chronic lung disease.  No acute cardiopulmonary abnormality. Electronically Signed   By: Genevie Ann M.D.   On: 11/04/2020 06:25   DG Chest Portable 1 View  Result Date: 11/03/2020 CLINICAL DATA:  Altered mental status EXAM: PORTABLE CHEST 1 VIEW COMPARISON:  06/19/2016 FINDINGS: There is hyperinflation of the lungs compatible with COPD. Heart is normal size. Bibasilar scarring or atelectasis. No effusions. No acute bony abnormality. IMPRESSION: COPD.  Bibasilar atelectasis or scarring. Electronically Signed   By: Rolm Baptise M.D.   On: 11/03/2020 20:56     Labs:   Basic Metabolic Panel: Recent Labs  Lab 10/29/20 1644 11/03/20 1942 11/03/20 2147 11/04/20 0116 11/04/20 0327 11/04/20 0533 11/04/20 1556 11/05/20 0809  NA 142 119*   < > 131* 135 135 137 138  K 3.2* 2.4*  --   --   --  3.3*  --  3.5  CL 102 86*  --   --   --  105  --  105  CO2 22 19*  --   --   --  24  --  24  GLUCOSE 103* 136*  --   --   --  135*  --  81  BUN 30* 14  --   --   --  9  --  7*  CREATININE 1.09* 0.63  --   --   --  0.79  --  0.62  CALCIUM 9.8 8.2*  --   --   --  8.3*  --  8.6*  MG  --  1.2*  --   --  2.0  --   --   --    < > = values in this interval not displayed.   GFR Estimated Creatinine Clearance: 54.9 mL/min (by C-G formula based on  SCr of 0.62 mg/dL). Liver Function Tests: Recent Labs  Lab 10/29/20 1644 11/03/20 1942  AST 37 36  ALT 42* 35  ALKPHOS 69 51  BILITOT 0.7 1.2  PROT 6.8 7.1  ALBUMIN 4.2 3.9   No results for input(s): LIPASE, AMYLASE in the last 168 hours. No results for input(s): AMMONIA in the last 168 hours. Coagulation profile Recent Labs  Lab 11/03/20 1942  INR 1.0    CBC: Recent Labs  Lab 10/29/20 1644 11/03/20 1942 11/04/20 0631  WBC 9.6 11.7* 6.9  NEUTROABS 6.7 9.2*  --   HGB 16.6* 14.1 14.3  HCT 48.6* 38.6 38.8  MCV 86 81.8 81.5  PLT 196 184 174   Cardiac Enzymes: No results for input(s): CKTOTAL, CKMB, CKMBINDEX, TROPONINI in the last  168 hours. BNP: Invalid input(s): POCBNP CBG: Recent Labs  Lab 11/03/20 1936  GLUCAP 154*   D-Dimer No results for input(s): DDIMER in the last 72 hours. Hgb A1c No results for input(s): HGBA1C in the last 72 hours. Lipid Profile No results for input(s): CHOL, HDL, LDLCALC, TRIG, CHOLHDL, LDLDIRECT in the last 72 hours. Thyroid function studies No results for input(s): TSH, T4TOTAL, T3FREE, THYROIDAB in the last 72 hours.  Invalid input(s): FREET3 Anemia work up No results for input(s): VITAMINB12, FOLATE, FERRITIN, TIBC, IRON, RETICCTPCT in the last 72 hours. Microbiology Recent Results (from the past 240 hour(s))  Resp Panel by RT-PCR (Flu A&B, Covid) Nasopharyngeal Swab     Status: None   Collection Time: 11/03/20  8:55 PM   Specimen: Nasopharyngeal Swab; Nasopharyngeal(NP) swabs in vial transport medium  Result Value Ref Range Status   SARS Coronavirus 2 by RT PCR NEGATIVE NEGATIVE Final    Comment: (NOTE) SARS-CoV-2 target nucleic acids are NOT DETECTED.  The SARS-CoV-2 RNA is generally detectable in upper respiratory specimens during the acute phase of infection. The lowest concentration of SARS-CoV-2 viral copies this assay can detect is 138 copies/mL. A negative result does not preclude SARS-Cov-2 infection and  should not be used as the sole basis for treatment or other patient management decisions. A negative result may occur with  improper specimen collection/handling, submission of specimen other than nasopharyngeal swab, presence of viral mutation(s) within the areas targeted by this assay, and inadequate number of viral copies(<138 copies/mL). A negative result must be combined with clinical observations, patient history, and epidemiological information. The expected result is Negative.  Fact Sheet for Patients:  EntrepreneurPulse.com.au  Fact Sheet for Healthcare Providers:  IncredibleEmployment.be  This test is no t yet approved or cleared by the [place] FDA and  has been authorized for detection and/or diagnosis of SARS-CoV-2 by FDA under an Emergency Use Authorization (EUA). This EUA will remain  in effect (meaning this test can be used) for the duration of the COVID-19 declaration under Section 564(b)(1) of the Act, 21 U.S.C.section 360bbb-3(b)(1), unless the authorization is terminated  or revoked sooner.       Influenza A by PCR NEGATIVE NEGATIVE Final   Influenza B by PCR NEGATIVE NEGATIVE Final    Comment: (NOTE) The Xpert Xpress SARS-CoV-2/FLU/RSV plus assay is intended as an aid in the diagnosis of influenza from Nasopharyngeal swab specimens and should not be used as a sole basis for treatment. Nasal washings and aspirates are unacceptable for Xpert Xpress SARS-CoV-2/FLU/RSV testing.  Fact Sheet for Patients: EntrepreneurPulse.com.au  Fact Sheet for Healthcare Providers: IncredibleEmployment.be  This test is not yet approved or cleared by the [place] FDA and has been authorized for detection and/or diagnosis of SARS-CoV-2 by FDA under an Emergency Use Authorization (EUA). This EUA will remain in effect (meaning this test can be used) for the duration of the COVID-19 declaration  under Section 564(b)(1) of the Act, 21 U.S.C. section 360bbb-3(b)(1), unless the authorization is terminated or revoked.  Performed at [facility], [address], [place], [place] [address]      Discharge Instructions:   Discharge Instructions    Diet - low sodium heart healthy   Complete by: As directed    Increase activity slowly   Complete by: As directed      Allergies as of 11/05/2020   No Known Allergies     Medication List    STOP taking these medications   bisoprolol-hydrochlorothiazide 10-6.25 MG tablet  Commonly known as: ZIAC   mirtazapine 15 MG tablet Commonly known as: Remeron     TAKE these medications   albuterol 108 (90 Base) MCG/ACT inhaler Commonly known as: VENTOLIN HFA TAKE 2 PUFFS 4 TIMES A DAY   amLODipine 5 MG tablet Commonly known as: NORVASC TAKE 1 TABLET BY MOUTH EVERY DAY   atorvastatin 20 MG tablet Commonly known as: LIPITOR TAKE 1 TABLET BY MOUTH DAILY AT 6 PM.   bisoprolol 10 MG tablet Commonly known as: ZEBETA Take 1 tablet (10 mg total) by mouth daily.   diazepam 5 MG tablet Commonly known as: VALIUM TAKE 1 TABLET BY MOUTH TWICE A DAY   donepezil 10 MG tablet Commonly known as: ARICEPT TAKE 1 TABLET BY MOUTH EVERYDAY AT BEDTIME   fluticasone 50 MCG/ACT nasal spray Commonly known as: FLONASE SPRAY 2 SPRAYS INTO EACH NOSTRIL EVERY DAY   losartan 25 MG tablet Commonly known as: COZAAR TAKE 2 TABLETS BY MOUTH DAILY (REPLACES 50MG  TABLET)   memantine 5 MG tablet Commonly known as: NAMENDA TAKE 1 TABLET BY MOUTH TWICE A DAY   multivitamin with minerals Tabs tablet Take 1 tablet by mouth daily. Start taking on: November 06, 2020         Time coordinating discharge: 31 minutes  Signed:  Jennye Boroughs  [facility] 11/05/2020, 12:13 PM   Pager on [email or web address]. If 7PM-7AM, please contact night-coverage at [email or web address]

## 2020-11-06 ENCOUNTER — Telehealth: Payer: Self-pay

## 2020-11-06 NOTE — Telephone Encounter (Signed)
Transition Care Management Follow-up Telephone Call  Date of discharge and from where: pt feels dizzy, a little groggy but doing better  How have you been since you were released from the hospital? Doing okay per son Ronalee Belts; pt has been taking 10mg  Diazepam qhs rather than the prescribed 5mg  bid but states the am dose makes her groggy; advised to discuss with Dr. Army Melia  Any questions or concerns? No  Items Reviewed:  Did the pt receive and understand the discharge instructions provided? Yes   Medications obtained and verified? Yes   Other? No   Any new allergies since your discharge? No   Dietary orders reviewed? Yes  Do you have support at home? Yes    Home Care and Equipment/Supplies: Were home health services ordered? no Were any new equipment or medical supplies ordered?  No  Functional Questionnaire: (I = Independent and D = Dependent) ADLs: I  Bathing/Dressing- I  Meal Prep- D  Eating- I  Maintaining continence- I  Transferring/Ambulation- I   Managing Meds- D  Follow up appointments reviewed:   PCP Hospital f/u appt confirmed? Yes  Scheduled to see Dr. Army Melia on 11/13/20 @ 3:40.  Are transportation arrangements needed? No   If their condition worsens, is the pt aware to call PCP or go to the Emergency Dept.? Yes  Was the patient provided with contact information for the PCP's office or ED? Yes  Was to pt encouraged to call back with questions or concerns? Yes

## 2020-11-13 ENCOUNTER — Other Ambulatory Visit: Payer: Self-pay

## 2020-11-13 ENCOUNTER — Ambulatory Visit (INDEPENDENT_AMBULATORY_CARE_PROVIDER_SITE_OTHER): Payer: Medicare Other | Admitting: Internal Medicine

## 2020-11-13 ENCOUNTER — Encounter: Payer: Self-pay | Admitting: Internal Medicine

## 2020-11-13 VITALS — BP 92/64 | HR 58 | Temp 98.3°F | Ht 63.0 in | Wt 120.6 lb

## 2020-11-13 DIAGNOSIS — E871 Hypo-osmolality and hyponatremia: Secondary | ICD-10-CM | POA: Diagnosis not present

## 2020-11-13 DIAGNOSIS — R413 Other amnesia: Secondary | ICD-10-CM

## 2020-11-13 DIAGNOSIS — I10 Essential (primary) hypertension: Secondary | ICD-10-CM | POA: Diagnosis not present

## 2020-11-13 NOTE — Progress Notes (Signed)
Date:  11/13/2020   Name:  Casey Lara   DOB:  1951-03-03   MRN:  [id number]   Chief Complaint: Hospitalization Follow-up Hospital follow up.  Admitted to [facility] 11/03/20 to 12/05/63 for metabolic encephalopathy from hyponatremia.  HCTZ was removed from medication list.  Mirtazapine was stopped. TOC call done on 11/06/20.  Discharge Diagnosis:   Principal Problem:   Hyponatremia, acute Active Problems:   History of malignant melanoma of skin   Essential (primary) hypertension   Nicotine dependence   Memory difficulties   Hypokalemia   Unintentional weight loss   Pulmonary nodules   Medication adverse effect   Acute metabolic encephalopathy   Hypomagnesemia  Medication List    STOP taking these medications   bisoprolol-hydrochlorothiazide 10-6.25 MG tablet Commonly known as: ZIAC   mirtazapine 15 MG tablet Commonly known as: Remeron     TAKE these medications   albuterol 108 (90 Base) MCG/ACT inhaler Commonly known as: VENTOLIN HFA TAKE 2 PUFFS 4 TIMES A DAY   amLODipine 5 MG tablet Commonly known as: NORVASC TAKE 1 TABLET BY MOUTH EVERY DAY   atorvastatin 20 MG tablet Commonly known as: LIPITOR TAKE 1 TABLET BY MOUTH DAILY AT 6 PM.   bisoprolol 10 MG tablet Commonly known as: ZEBETA Take 1 tablet (10 mg total) by mouth daily.   diazepam 5 MG tablet Commonly known as: VALIUM TAKE 1 TABLET BY MOUTH TWICE A DAY   donepezil 10 MG tablet Commonly known as: ARICEPT TAKE 1 TABLET BY MOUTH EVERYDAY AT BEDTIME   fluticasone 50 MCG/ACT nasal spray Commonly known as: FLONASE SPRAY 2 SPRAYS INTO EACH NOSTRIL EVERY DAY   losartan 25 MG tablet Commonly known as: COZAAR TAKE 2 TABLETS BY MOUTH DAILY (REPLACES 50MG  TABLET)   memantine 5 MG tablet Commonly known as: NAMENDA TAKE 1 TABLET BY MOUTH TWICE A DAY   multivitamin with minerals Tabs tablet Take 1 tablet by mouth daily. Start taking on: November 06, 2020     CT HEAD WO  CONTRAST  Result Date: 11/03/2020 CLINICAL DATA:  Headache and confusion. Started new medications 2 days ago. EXAM: CT HEAD WITHOUT CONTRAST TECHNIQUE: Contiguous axial images were obtained from the base of the skull through the vertex without intravenous contrast. COMPARISON:  None. FINDINGS: Brain: No evidence of acute infarction, hemorrhage, hydrocephalus, extra-axial collection or mass lesion/mass effect. Mild cerebral atrophy. Patchy low-attenuation changes in the deep white matter suggesting small vessel ischemic change. Vascular: Intracranial arterial vascular calcifications are present. Skull: Calvarium appears intact. Sinuses/Orbits: Paranasal sinuses and mastoid air cells are clear. Other: None. IMPRESSION: 1. No acute intracranial abnormalities. 2. Mild cerebral atrophy and small vessel ischemic changes. Electronically Signed   By: Lucienne Capers M.D.   On: 11/03/2020 20:06   DG Chest Port 1 View  Result Date: 11/04/2020 CLINICAL DATA:  70 year old female with hypoxia. Recent altered mental status. EXAM: PORTABLE CHEST 1 VIEW COMPARISON:  Portable chest 11/03/2020 and earlier. FINDINGS: Portable AP upright view at 0557 hours. Larger lung volumes, underlying chronic pulmonary hyperinflation suspected. Stable cardiac size and mediastinal contours. Calcified granuloma right mid lung. Mild chronic increased interstitial markings otherwise. No pneumothorax, pulmonary edema, pleural effusion or acute pulmonary opacity. Visualized tracheal air column is within normal limits. No acute osseous abnormality identified. Negative visible bowel gas pattern. IMPRESSION: Stable chronic lung disease.  No acute cardiopulmonary abnormality. Electronically Signed   By: Genevie Ann M.D.   On: 11/04/2020 06:25   DG Chest Portable 1 View  Result Date: 11/03/2020 CLINICAL DATA:  Altered mental status EXAM: PORTABLE CHEST 1 VIEW COMPARISON:  06/19/2016 FINDINGS: There is hyperinflation of the lungs compatible with COPD.  Heart is normal size. Bibasilar scarring or atelectasis. No effusions. No acute bony abnormality. IMPRESSION: COPD.  Bibasilar atelectasis or scarring. Electronically Signed   By: Rolm Baptise M.D.   On: 11/03/2020 20:56   US BREAST LTD UNI LEFT INC AXILLA  Result Date: 10/28/2020 CLINICAL DATA:  Callback from screening mammogram for possible asymmetry left breast EXAM: DIGITAL DIAGNOSTIC UNILATERAL LEFT MAMMOGRAM WITH TOMOSYNTHESIS AND CAD; ULTRASOUND LEFT BREAST LIMITED TECHNIQUE: Left digital diagnostic mammography and breast tomosynthesis was performed. The images were evaluated with computer-aided detection.; Targeted ultrasound examination of the left breast was performed COMPARISON:  Prior films ACR Breast Density Category c: The breast tissue is heterogeneously dense, which may obscure small masses. FINDINGS: Lateral view of left breast, spot compression left cc view are submitted. The previously questioned asymmetry is less prominent compared prior exam is unchanged compared to prior mammogram 2019. Targeted ultrasound is performed, showing no focal abnormal discrete cystic or solid lesion in the retroareolar medial left breast. IMPRESSION: Benign findings. RECOMMENDATION: Routine screening mammogram back on schedule. I have discussed the findings and recommendations with the patient. If applicable, a reminder letter will be sent to the patient regarding the next appointment. BI-RADS CATEGORY  2: Benign. Electronically Signed   By: Abelardo Diesel M.D.   On: 10/28/2020 14:42   MM DIAG BREAST TOMO UNI LEFT  Result Date: 10/28/2020 CLINICAL DATA:  Callback from screening mammogram for possible asymmetry left breast EXAM: DIGITAL DIAGNOSTIC UNILATERAL LEFT MAMMOGRAM WITH TOMOSYNTHESIS AND CAD; ULTRASOUND LEFT BREAST LIMITED TECHNIQUE: Left digital diagnostic mammography and breast tomosynthesis was performed. The images were evaluated with computer-aided detection.; Targeted ultrasound examination of the  left breast was performed COMPARISON:  Prior films ACR Breast Density Category c: The breast tissue is heterogeneously dense, which may obscure small masses. FINDINGS: Lateral view of left breast, spot compression left cc view are submitted. The previously questioned asymmetry is less prominent compared prior exam is unchanged compared to prior mammogram 2019. Targeted ultrasound is performed, showing no focal abnormal discrete cystic or solid lesion in the retroareolar medial left breast. IMPRESSION: Benign findings. RECOMMENDATION: Routine screening mammogram back on schedule. I have discussed the findings and recommendations with the patient. If applicable, a reminder letter will be sent to the patient regarding the next appointment. BI-RADS CATEGORY  2: Benign. Electronically Signed   By: Abelardo Diesel M.D.   On: 10/28/2020 14:42    Hypertension This is a chronic problem. The problem is controlled. Pertinent negatives include no headaches or shortness of breath. Past treatments include beta blockers. The current treatment provides significant improvement. There are no compliance problems.    Hyponatremia - HCTZ and mirtazapine stopped.   Confusion back to baseline.  Appetite good.  Drinking about 80 oz water daily.  Memory issues/weight loss - on Aricept; last visit I thought she had stopped Memantine but she is still taking it.    Lab Results  Component Value Date   CREATININE 0.62 11/05/2020   BUN 7 (L) 11/05/2020   NA 138 11/05/2020   K 3.5 11/05/2020   CL 105 11/05/2020   CO2 24 11/05/2020   Lab Results  Component Value Date   CHOL 108 10/29/2020   HDL 27 (L) 10/29/2020   LDLCALC 53 10/29/2020   TRIG 166 (H) 10/29/2020   CHOLHDL 4.0 10/29/2020  Lab Results  Component Value Date   TSH 2.190 10/29/2020   Lab Results  Component Value Date   HGBA1C 5.9 (H) 10/29/2020   Lab Results  Component Value Date   WBC 6.9 11/04/2020   HGB 14.3 11/04/2020   HCT 38.8 11/04/2020   MCV  81.5 11/04/2020   PLT 174 11/04/2020   Lab Results  Component Value Date   ALT 35 11/03/2020   AST 36 11/03/2020   ALKPHOS 51 11/03/2020   BILITOT 1.2 11/03/2020     Review of Systems  Constitutional: Positive for appetite change and fatigue. Negative for chills, diaphoresis and unexpected weight change (gained 3 lbs).  Eyes: Negative for visual disturbance.  Respiratory: Positive for cough. Negative for chest tightness and shortness of breath.   Cardiovascular: Negative for leg swelling.  Gastrointestinal: Negative for abdominal pain, constipation, diarrhea, nausea and vomiting.  Neurological: Negative for dizziness, tremors, speech difficulty, weakness and headaches.  Psychiatric/Behavioral: Positive for confusion and sleep disturbance. Negative for agitation, behavioral problems and hallucinations. The patient is not hyperactive.     Patient Active Problem List   Diagnosis Date Noted  . Hypokalemia 11/03/2020  . Unintentional weight loss 11/03/2020  . Pulmonary nodules 11/03/2020  . Medication adverse effect 11/03/2020  . Hypomagnesemia 11/03/2020  . Moderate protein-calorie malnutrition ([place]) 10/29/2020  . Aortic atherosclerosis ([place]) 10/01/2020  . Memory difficulties 12/23/2018  . Nicotine dependence 06/19/2016  . Adenomatous colon polyp 06/19/2015  . Constipation 06/19/2015  . Bilateral low back pain with left-sided sciatica 06/19/2015  . H/O varicella 04/12/2015  . Obstructive apnea 02/11/2015  . History of malignant melanoma of skin 02/11/2015  . Dyslipidemia 02/11/2015  . Allergic state 02/11/2015  . Essential (primary) hypertension 02/11/2015  . Anxiety 02/11/2015  . Insomnia, persistent 02/11/2015  . CMC arthritis, thumb, degenerative 02/11/2015    Allergies  Allergen Reactions  . Mirtazapine Other (See Comments)    Hyponatremia    Past Surgical History:  Procedure Laterality Date  . CARPAL TUNNEL RELEASE Left 2014  . COLONOSCOPY  05/2013   serrated  adenoma  . LUMBAR DISC SURGERY    . MELANOMA EXCISION  2015    Social History   Tobacco Use  . Smoking status: Current Every Day Smoker    Packs/day: 1.00    Years: 50.00    Pack years: 50.00    Types: Cigarettes    Start date: 08/03/1966  . Smokeless tobacco: Never Used  . Tobacco comment: smoking since age 83  1 pack daily   Vaping Use  . Vaping Use: Never used  Substance Use Topics  . Alcohol use: No    Alcohol/week: 0.0 standard drinks  . Drug use: No     Medication list has been reviewed and updated.  Current Meds  Medication Sig  . albuterol (PROVENTIL HFA;VENTOLIN HFA) 108 (90 Base) MCG/ACT inhaler TAKE 2 PUFFS 4 TIMES A DAY  . amLODipine (NORVASC) 5 MG tablet TAKE 1 TABLET BY MOUTH EVERY DAY  . atorvastatin (LIPITOR) 20 MG tablet TAKE 1 TABLET BY MOUTH DAILY AT 6 PM.  . bisoprolol (ZEBETA) 10 MG tablet Take 1 tablet (10 mg total) by mouth daily.  . diazepam (VALIUM) 5 MG tablet TAKE 1 TABLET BY MOUTH TWICE A DAY (Patient taking differently: Take 5-10 mg by mouth at bedtime.)  . donepezil (ARICEPT) 10 MG tablet TAKE 1 TABLET BY MOUTH EVERYDAY AT BEDTIME  . fluticasone (FLONASE) 50 MCG/ACT nasal spray SPRAY 2 SPRAYS INTO EACH NOSTRIL EVERY DAY  .  losartan (COZAAR) 25 MG tablet TAKE 2 TABLETS BY MOUTH DAILY (REPLACES 50MG  TABLET)  . memantine (NAMENDA) 5 MG tablet Take 5 mg by mouth 2 (two) times daily.  . Multiple Vitamin (MULTIVITAMIN WITH MINERALS) TABS tablet Take 1 tablet by mouth daily.    PHQ 2/9 Scores 11/13/2020 10/29/2020 05/13/2020 01/12/2020  PHQ - 2 Score 0 0 0 0  PHQ- 9 Score 3 8 0 3    GAD 7 : Generalized Anxiety Score 11/13/2020 10/29/2020 05/13/2020 01/12/2020  Nervous, Anxious, on Edge 0 0 0 0  Control/stop worrying 0 0 0 0  Worry too much - different things 0 0 0 0  Trouble relaxing 0 0 0 0  Restless 0 0 0 0  Easily annoyed or irritable 0 0 0 0  Afraid - awful might happen 0 0 0 0  Total GAD 7 Score 0 0 0 0  Anxiety Difficulty Not difficult at  all Not difficult at all Not difficult at all Not difficult at all    BP Readings from Last 3 Encounters:  11/13/20 92/64  11/05/20 110/67  10/29/20 98/76    Physical Exam Vitals and nursing note reviewed.  Constitutional:      General: She is not in acute distress.    Appearance: Normal appearance. She is well-developed.  HENT:     Head: Normocephalic and atraumatic.  Cardiovascular:     Rate and Rhythm: Normal rate and regular rhythm.     Pulses: Normal pulses.  Pulmonary:     Effort: Pulmonary effort is normal. No respiratory distress.     Breath sounds: Normal breath sounds. No wheezing or rhonchi.  Musculoskeletal:     Cervical back: Normal range of motion.     Right lower leg: No edema.     Left lower leg: No edema.  Lymphadenopathy:     Cervical: No cervical adenopathy.  Skin:    General: Skin is warm and dry.     Capillary Refill: Capillary refill takes less than 2 seconds.     Findings: No rash.  Neurological:     General: No focal deficit present.     Mental Status: She is alert and oriented to person, place, and time.  Psychiatric:        Mood and Affect: Mood normal.        Behavior: Behavior normal.     Wt Readings from Last 3 Encounters:  11/13/20 120 lb 9.6 oz (54.7 kg)  11/04/20 116 lb 4.8 oz (52.8 kg)  10/29/20 113 lb (51.3 kg)    BP 92/64 (BP Location: Left Arm, Patient Position: Sitting, Cuff Size: Normal)   Pulse (!) 58   Temp 98.3 F (36.8 C) (Oral)   Ht 5\' 3"  (1.6 m)   Wt 120 lb 9.6 oz (54.7 kg)   SpO2 93%   BMI 21.36 kg/m   Assessment and Plan: 1. Hyponatremia Resolved with medication changes Continue normal fluid consumption; sodium ad lib HCTZ discontinued Follow up in 4 months - Basic metabolic panel  2. Essential (primary) hypertension Clinically stable exam with well controlled BP with change in therapy. Monitor BP at home. Pt to continue current regimen and low sodium diet; benefits of regular exercise as able  discussed.  3. Memory difficulties Now on Aricept and Memantine   Partially dictated using Editor, commissioning. Any errors are unintentional.  Halina Maidens, MD [facility]  11/13/2020

## 2020-11-14 LAB — BASIC METABOLIC PANEL
BUN/Creatinine Ratio: 17 (ref 12–28)
BUN: 16 mg/dL (ref 8–27)
CO2: 19 mmol/L — ABNORMAL LOW (ref 20–29)
Calcium: 8.8 mg/dL (ref 8.7–10.3)
Chloride: 109 mmol/L — ABNORMAL HIGH (ref 96–106)
Creatinine, Ser: 0.92 mg/dL (ref 0.57–1.00)
Glucose: 193 mg/dL — ABNORMAL HIGH (ref 65–99)
Potassium: 3.9 mmol/L (ref 3.5–5.2)
Sodium: 144 mmol/L (ref 134–144)
eGFR: 67 mL/min/{1.73_m2} (ref >59)

## 2020-11-21 ENCOUNTER — Telehealth: Payer: Self-pay

## 2020-11-21 NOTE — Telephone Encounter (Signed)
Copied from [place] [id number]. Topic: General - Other >> Nov 20, 2020  4:11 PM Tessa Lerner A wrote: Reason for CRM: Patient('s son) has made contact regarding patient's most recent lab results  Patient had patient's son would like to be contacted by a member of staff to go over them when possible

## 2020-11-21 NOTE — Telephone Encounter (Signed)
I tried to call son "Legrand Como cannot get number to go through with and without a 1. Called [facility] number and left a vm that we were returning a call concerning labs

## 2020-11-22 NOTE — Progress Notes (Signed)
Tried to call pt X4 will wait on son to call back.  Will route this note to [facility] Nurse just in case the patient returns call to clinic. [facility] Nurse may give patient results if pt returns the call.  CRM has also been created for this msg for call center purposes. Please attach any notes regarding this lab to this result message and do not create a new CRM.   KP

## 2020-12-11 ENCOUNTER — Ambulatory Visit: Payer: Self-pay | Admitting: Internal Medicine

## 2020-12-14 ENCOUNTER — Other Ambulatory Visit: Payer: Self-pay | Admitting: Internal Medicine

## 2020-12-14 DIAGNOSIS — I10 Essential (primary) hypertension: Secondary | ICD-10-CM

## 2020-12-14 NOTE — Telephone Encounter (Signed)
Requested Prescriptions  Pending Prescriptions Disp Refills  . amLODipine (NORVASC) 5 MG tablet [Pharmacy Med Name: AMLODIPINE BESYLATE 5 MG TAB] 90 tablet 0    Sig: TAKE 1 TABLET BY MOUTH EVERY DAY     Cardiovascular:  Calcium Channel Blockers Passed - 12/14/2020  4:45 PM      Passed - Last BP in normal range    BP Readings from Last 1 Encounters:  11/13/20 92/64         Passed - Valid encounter within last 6 months    Recent Outpatient Visits          1 month ago Hyponatremia   [facility] Glean Hess, MD   1 month ago Weight loss, unintentional   [facility] Glean Hess, MD   7 months ago Essential (primary) hypertension   [facility] Glean Hess, MD   11 months ago Memory difficulties   [facility] Glean Hess, MD   1 year ago Medicare annual wellness visit, subsequent   [facility] Glean Hess, MD      Future Appointments            In 1 month [name], MD [facility], [place]   In 3 months Glean Hess, MD [facility], [facility]           . losartan (COZAAR) 25 MG tablet [Pharmacy Med Name: LOSARTAN POTASSIUM 25 MG TAB] 180 tablet 0    Sig: TAKE 2 TABLETS BY MOUTH DAILY (REPLACES 50MG  TABLET)     Cardiovascular:  Angiotensin Receptor Blockers Passed - 12/14/2020  4:45 PM      Passed - Cr in normal range and within 180 days    Creatinine, Ser  Date Value Ref Range Status  11/13/2020 0.92 0.57 - 1.00 mg/dL Final         Passed - K in normal range and within 180 days    Potassium  Date Value Ref Range Status  11/13/2020 3.9 3.5 - 5.2 mmol/L Final         Passed - Patient is not pregnant      Passed - Last BP in normal range    BP Readings from Last 1 Encounters:  11/13/20 92/64         Passed - Valid encounter within last 6 months    Recent Outpatient Visits          1 month ago Hyponatremia   [facility] Glean Hess, MD   1 month  ago Weight loss, unintentional   [place], [place]   7 months ago Essential (primary) hypertension   [facility] Glean Hess, MD   11 months ago Memory difficulties   [facility] Glean Hess, MD   1 year ago Medicare annual wellness visit, subsequent   [facility] Glean Hess, MD      Future Appointments            In 1 month [name], MD [facility], [place]   In 3 months Army Melia, Jesse Sans, MD [facility], [facility]

## 2021-01-07 ENCOUNTER — Other Ambulatory Visit: Payer: Self-pay | Admitting: Internal Medicine

## 2021-01-07 NOTE — Telephone Encounter (Signed)
Requested medication (s) are due for refill today - yes  Requested medication (s) are on the active medication list -yes  Future visit scheduled -yes  Last refill: 2 months  Notes to clinic: request RF- outside provider  Requested Prescriptions  Pending Prescriptions Disp Refills   bisoprolol (ZEBETA) 10 MG tablet 30 tablet 0    Sig: Take 1 tablet (10 mg total) by mouth daily.      Cardiovascular:  Beta Blockers Passed - 01/07/2021  3:34 PM      Passed - Last BP in normal range    BP Readings from Last 1 Encounters:  11/13/20 92/64          Passed - Last Heart Rate in normal range    Pulse Readings from Last 1 Encounters:  11/13/20 (!) 58          Passed - Valid encounter within last 6 months    Recent Outpatient Visits           1 month ago Hyponatremia   [facility] Glean Hess, MD   2 months ago Weight loss, unintentional   [facility] Glean Hess, MD   7 months ago Essential (primary) hypertension   [facility] Glean Hess, MD   12 months ago Memory difficulties   [facility] Glean Hess, MD   1 year ago Medicare annual wellness visit, subsequent   [facility] Glean Hess, MD       Future Appointments             In 2 weeks [name], MD [facility], [place]   In 2 months Glean Hess, MD [facility], [facility]                 Requested Prescriptions  Pending Prescriptions Disp Refills   bisoprolol (ZEBETA) 10 MG tablet 30 tablet 0    Sig: Take 1 tablet (10 mg total) by mouth daily.      Cardiovascular:  Beta Blockers Passed - 01/07/2021  3:34 PM      Passed - Last BP in normal range    BP Readings from Last 1 Encounters:  11/13/20 92/64          Passed - Last Heart Rate in normal range    Pulse Readings from Last 1 Encounters:  11/13/20 (!) 58          Passed - Valid encounter within last 6 months    Recent Outpatient Visits            1 month ago Hyponatremia   [facility] Glean Hess, MD   2 months ago Weight loss, unintentional   [place], MD   7 months ago Essential (primary) hypertension   [facility] Glean Hess, MD   12 months ago Memory difficulties   [facility] Glean Hess, MD   1 year ago Medicare annual wellness visit, subsequent   [facility] Glean Hess, MD       Future Appointments             In 2 weeks [name], MD [facility], [place]   In 2 months [name], MD [facility], [facility]

## 2021-01-07 NOTE — Telephone Encounter (Signed)
Medication Refill - Medication:   bisoprolol (ZEBETA) 10 MG tablet    Has the patient contacted their pharmacy? Yes.  previously filled by a different provider, caller wanted to know if Dr Army Melia would refill.    Preferred Pharmacy (with phone number or street name):   [id number] - [place], [place]  [address], [place] [address]  Phone:  [phone] Fax:  [phone]   Agent: Please be advised that RX refills may take up to 3 business days. We ask that you follow-up with your pharmacy.

## 2021-01-08 ENCOUNTER — Other Ambulatory Visit: Payer: Self-pay | Admitting: Internal Medicine

## 2021-01-08 DIAGNOSIS — G4700 Insomnia, unspecified: Secondary | ICD-10-CM

## 2021-01-08 MED ORDER — BISOPROLOL FUMARATE 10 MG PO TABS: 10.0000 mg | ORAL_TABLET | Freq: Every day | ORAL | 0 refills | Status: DC

## 2021-01-08 NOTE — Telephone Encounter (Signed)
Requested medication (s) are due for refill today:yes  Requested medication (s) are on the active medication list: yes  Last refill: 07/04/20   #60  2 refills  Future visit scheduled Yes 01/27/21  Notes to clinic: not delegated  Requested Prescriptions  Pending Prescriptions Disp Refills   diazepam (VALIUM) 5 MG tablet [Pharmacy Med Name: DIAZEPAM 5 MG TABLET] 60 tablet     Sig: TAKE 1 TABLET BY MOUTH TWICE A DAY      Not Delegated - Psychiatry:  Anxiolytics/Hypnotics Failed - 01/08/2021  3:22 PM      Failed - This refill cannot be delegated      Failed - Urine Drug Screen completed in last 360 days      Passed - Valid encounter within last 6 months    Recent Outpatient Visits           1 month ago Hyponatremia   [facility] Glean Hess, MD   2 months ago Weight loss, unintentional   [facility] Glean Hess, MD   8 months ago Essential (primary) hypertension   [facility] Glean Hess, MD   12 months ago Memory difficulties   [facility] Glean Hess, MD   1 year ago Medicare annual wellness visit, subsequent   [facility] Glean Hess, MD       Future Appointments             In 2 weeks [name], MD [facility], [place]   In 2 months Army Melia, Jesse Sans, MD [facility], [facility]              Refused Prescriptions Disp Refills   bisoprolol (ZEBETA) 10 MG tablet [Pharmacy Med Name: BISOPROLOL FUMARATE 10 MG TAB] 30 tablet 0    Sig: TAKE 1 TABLET BY MOUTH EVERY DAY      Cardiovascular:  Beta Blockers Passed - 01/08/2021  3:22 PM      Passed - Last BP in normal range    BP Readings from Last 1 Encounters:  11/13/20 92/64          Passed - Last Heart Rate in normal range    Pulse Readings from Last 1 Encounters:  11/13/20 (!) 58          Passed - Valid encounter within last 6 months    Recent Outpatient Visits           1 month ago Hyponatremia   [facility] Glean Hess, MD   2 months ago Weight loss, unintentional   [facility] Glean Hess, MD   8 months ago Essential (primary) hypertension   [facility] Glean Hess, MD   12 months ago Memory difficulties   [facility] Glean Hess, MD   1 year ago Medicare annual wellness visit, subsequent   [facility] Glean Hess, MD       Future Appointments             In 2 weeks [name], MD [facility], [place]   In 2 months Army Melia, Jesse Sans, MD [facility], [facility]

## 2021-01-09 NOTE — Telephone Encounter (Signed)
Refill if appropriate.  Please advise.  

## 2021-01-27 ENCOUNTER — Encounter: Payer: Medicare Other | Admitting: Internal Medicine

## 2021-01-27 NOTE — Progress Notes (Deleted)
Date:  01/27/2021   Name:  Casey Lara   DOB:  01-Nov-1950   MRN:  [id number]   Chief Complaint: No chief complaint on file. Casey Lara is a 70 y.o. female who presents today for her Complete Annual Exam. She feels {DESC; WELL/FAIRLY WELL/POORLY:18703}. She reports exercising ***. She reports she is sleeping {DESC; WELL/FAIRLY WELL/POORLY:18703}. Breast complaints ***.  Mammogram: 09/2020 Left breast asymmetry - repeat US and Dx mammo benign DEXA: 08/2008 Pap smear: discontinued Colonoscopy: 05/2013  Immunization History  Administered Date(s) Administered   Fluad Quad(high Dose 65+) 05/13/2020   Influenza, High Dose Seasonal PF 06/14/2018   Influenza,inj,Quad PF,6+ Mos 06/19/2015, 06/19/2016, 08/19/2017   PFIZER(Purple Top)SARS-COV-2 Vaccination 09/29/2019, 10/31/2019, 08/16/2020   Pneumococcal Conjugate-13 06/19/2016   Pneumococcal Polysaccharide-23 05/29/2013, 08/22/2018   Tdap 01/17/2014    Hypertension This is a chronic problem. The problem is controlled. Associated symptoms include anxiety. Pertinent negatives include no chest pain, headaches, palpitations or shortness of breath. Past treatments include beta blockers, calcium channel blockers and angiotensin blockers. The current treatment provides significant improvement. There is no history of kidney disease, CAD/MI or CVA.  Hyperlipidemia This is a chronic problem. The problem is controlled. Pertinent negatives include no chest pain or shortness of breath. Current antihyperlipidemic treatment includes statins. The current treatment provides significant improvement of lipids.  Anxiety Presents for follow-up visit. Symptoms include excessive worry, insomnia and nervous/anxious behavior. Patient reports no chest pain, dizziness, palpitations or shortness of breath. Symptoms occur constantly.   [facility] - Aricept and Namenda started one year ago.  She declined Neurology evaluation. CT and metabolic testing were  negative.  She is living with her son who manages her pill box.  She is not driving.  Lab Results  Component Value Date   CREATININE 0.92 11/13/2020   BUN 16 11/13/2020   NA 144 11/13/2020   K 3.9 11/13/2020   CL 109 (H) 11/13/2020   CO2 19 (L) 11/13/2020   Lab Results  Component Value Date   CHOL 108 10/29/2020   HDL 27 (L) 10/29/2020   LDLCALC 53 10/29/2020   TRIG 166 (H) 10/29/2020   CHOLHDL 4.0 10/29/2020   Lab Results  Component Value Date   TSH 2.190 10/29/2020   Lab Results  Component Value Date   HGBA1C 5.9 (H) 10/29/2020   Lab Results  Component Value Date   WBC 6.9 11/04/2020   HGB 14.3 11/04/2020   HCT 38.8 11/04/2020   MCV 81.5 11/04/2020   PLT 174 11/04/2020   Lab Results  Component Value Date   ALT 35 11/03/2020   AST 36 11/03/2020   ALKPHOS 51 11/03/2020   BILITOT 1.2 11/03/2020     Review of Systems  Constitutional:  Negative for chills, fatigue and fever.  HENT:  Negative for congestion, hearing loss, tinnitus, trouble swallowing and voice change.   Eyes:  Negative for visual disturbance.  Respiratory:  Negative for cough, chest tightness, shortness of breath and wheezing.   Cardiovascular:  Negative for chest pain, palpitations and leg swelling.  Gastrointestinal:  Negative for abdominal pain, constipation, diarrhea and vomiting.  Endocrine: Negative for polydipsia and polyuria.  Genitourinary:  Negative for dysuria, frequency, genital sores, vaginal bleeding and vaginal discharge.  Musculoskeletal:  Negative for arthralgias, gait problem and joint swelling.  Skin:  Negative for color change and rash.  Neurological:  Negative for dizziness, tremors, light-headedness and headaches.  Hematological:  Negative for adenopathy. Does not bruise/bleed easily.  Psychiatric/Behavioral:  Negative for  dysphoric mood and sleep disturbance. The patient is nervous/anxious and has insomnia.    Patient Active Problem List   Diagnosis Date Noted    Hyponatremia, acute 11/03/2020   Hypokalemia 11/03/2020   Unintentional weight loss 11/03/2020   Pulmonary nodules 11/03/2020   Hypomagnesemia 11/03/2020   Moderate protein-calorie malnutrition ([place]) 10/29/2020   Aortic atherosclerosis ([place]) 10/01/2020   Memory difficulties 12/23/2018   Nicotine dependence 06/19/2016   Adenomatous colon polyp 06/19/2015   Constipation 06/19/2015   Bilateral low back pain with left-sided sciatica 06/19/2015   H/O varicella 04/12/2015   Obstructive apnea 02/11/2015   History of malignant melanoma of skin 02/11/2015   Dyslipidemia 02/11/2015   Allergic state 02/11/2015   Essential (primary) hypertension 02/11/2015   Anxiety 02/11/2015   Insomnia, persistent 02/11/2015   CMC arthritis, thumb, degenerative 02/11/2015    Allergies  Allergen Reactions   Mirtazapine Other (See Comments)    Hyponatremia    Past Surgical History:  Procedure Laterality Date   CARPAL TUNNEL RELEASE Left 2014   COLONOSCOPY  05/2013   serrated adenoma   LUMBAR DISC SURGERY     MELANOMA EXCISION  2015    Social History   Tobacco Use   Smoking status: Every Day    Packs/day: 1.00    Years: 50.00    Pack years: 50.00    Types: Cigarettes    Start date: 08/03/1966   Smokeless tobacco: Never   Tobacco comments:    smoking since age 70  1 pack daily   Vaping Use   Vaping Use: Never used  Substance Use Topics   Alcohol use: No    Alcohol/week: 0.0 standard drinks   Drug use: No     Medication list has been reviewed and updated.  No outpatient medications have been marked as taking for the 01/27/21 encounter (Appointment) with Glean Hess, MD.    [facility] 2/9 Scores 11/13/2020 10/29/2020 05/13/2020 01/12/2020  PHQ - 2 Score 0 0 0 0  PHQ- 9 Score 3 8 0 3    GAD 7 : Generalized Anxiety Score 11/13/2020 10/29/2020 05/13/2020 01/12/2020  Nervous, Anxious, on Edge 0 0 0 0  Control/stop worrying 0 0 0 0  Worry too much - different things 0 0 0 0  Trouble relaxing  0 0 0 0  Restless 0 0 0 0  Easily annoyed or irritable 0 0 0 0  Afraid - awful might happen 0 0 0 0  Total GAD 7 Score 0 0 0 0  Anxiety Difficulty Not difficult at all Not difficult at all Not difficult at all Not difficult at all    BP Readings from Last 3 Encounters:  11/13/20 92/64  11/05/20 110/67  10/29/20 98/76    Physical Exam Vitals and nursing note reviewed.  Constitutional:      General: She is not in acute distress.    Appearance: She is well-developed.  HENT:     Head: Normocephalic and atraumatic.     Right Ear: Tympanic membrane and ear canal normal.     Left Ear: Tympanic membrane and ear canal normal.     Nose:     Right Sinus: No maxillary sinus tenderness.     Left Sinus: No maxillary sinus tenderness.  Eyes:     General: No scleral icterus.       Right eye: No discharge.        Left eye: No discharge.     Conjunctiva/sclera: Conjunctivae normal.  Neck:  Thyroid: No thyromegaly.     Vascular: No carotid bruit.  Cardiovascular:     Rate and Rhythm: Normal rate and regular rhythm.     Pulses: Normal pulses.     Heart sounds: Normal heart sounds.  Pulmonary:     Effort: Pulmonary effort is normal. No respiratory distress.     Breath sounds: No wheezing.  Chest:  Breasts:    Right: No mass, nipple discharge, skin change or tenderness.     Left: No mass, nipple discharge, skin change or tenderness.  Abdominal:     General: Bowel sounds are normal.     Palpations: Abdomen is soft.     Tenderness: There is no abdominal tenderness.  Musculoskeletal:        General: Normal range of motion.     Cervical back: Normal range of motion. No erythema.     Right lower leg: No edema.     Left lower leg: No edema.  Lymphadenopathy:     Cervical: No cervical adenopathy.  Skin:    General: Skin is warm and dry.     Findings: No rash.  Neurological:     General: No focal deficit present.     Mental Status: She is alert and oriented to person, place, and  time.     Cranial Nerves: No cranial nerve deficit.     Sensory: No sensory deficit.     Deep Tendon Reflexes: Reflexes are normal and symmetric.  Psychiatric:        Attention and Perception: Attention normal.        Mood and Affect: Mood normal.    Wt Readings from Last 3 Encounters:  11/13/20 120 lb 9.6 oz (54.7 kg)  11/04/20 116 lb 4.8 oz (52.8 kg)  10/29/20 113 lb (51.3 kg)    There were no vitals taken for this visit.  Assessment and Plan:

## 2021-01-28 ENCOUNTER — Telehealth: Payer: Self-pay | Admitting: Internal Medicine

## 2021-01-28 NOTE — Telephone Encounter (Signed)
Copied from [place] [phone]. Topic: Medicare AWV >> Jan 28, 2021 10:31 AM Cher Nakai R wrote: Reason for CRM: Left message for patient to call back and schedule Medicare Annual Wellness Visit (AWV) in office.   If unable to come into the office for AWV,  please offer to do virtually or by telephone.  Last AWV:  09/28/2019  Please schedule at anytime with [facility] Health Advisor.  40 minute appointment  Any questions, please contact me at [phone]

## 2021-01-31 ENCOUNTER — Other Ambulatory Visit: Payer: Self-pay | Admitting: Internal Medicine

## 2021-02-14 ENCOUNTER — Other Ambulatory Visit: Payer: Self-pay | Admitting: Internal Medicine

## 2021-03-13 ENCOUNTER — Other Ambulatory Visit: Payer: Self-pay | Admitting: Internal Medicine

## 2021-03-13 DIAGNOSIS — G4700 Insomnia, unspecified: Secondary | ICD-10-CM

## 2021-03-13 NOTE — Telephone Encounter (Signed)
Requested medications are due for refill today yes  Requested medications are on the active medication list yes  Last refill 6/9  Last visit 4/13  Future visit scheduled 8/15  Notes to clinic Not Delegated

## 2021-03-14 NOTE — Telephone Encounter (Signed)
Please review. Last office visit 11/13/20.  KP

## 2021-03-17 ENCOUNTER — Encounter: Payer: Self-pay | Admitting: Internal Medicine

## 2021-03-17 ENCOUNTER — Ambulatory Visit (INDEPENDENT_AMBULATORY_CARE_PROVIDER_SITE_OTHER): Payer: Medicare Other | Admitting: Internal Medicine

## 2021-03-17 ENCOUNTER — Other Ambulatory Visit: Payer: Self-pay

## 2021-03-17 VITALS — BP 102/76 | HR 58 | Temp 98.6°F | Ht 63.0 in | Wt 156.0 lb

## 2021-03-17 DIAGNOSIS — G3184 Mild cognitive impairment, so stated: Secondary | ICD-10-CM

## 2021-03-17 DIAGNOSIS — F17201 Nicotine dependence, unspecified, in remission: Secondary | ICD-10-CM

## 2021-03-17 DIAGNOSIS — F411 Generalized anxiety disorder: Secondary | ICD-10-CM | POA: Diagnosis not present

## 2021-03-17 DIAGNOSIS — I10 Essential (primary) hypertension: Secondary | ICD-10-CM

## 2021-03-17 NOTE — Progress Notes (Signed)
Date:  03/17/2021   Name:  Casey Lara   DOB:  1950/12/27   MRN:  [id number]   Chief Complaint: Hypertension  Hypertension This is a chronic problem. The problem is controlled. Pertinent negatives include no chest pain, headaches or shortness of breath. Past treatments include angiotensin blockers and beta blockers. The current treatment provides significant improvement. There are no compliance problems.   MCI - now on both Aricept and Namenda.  Deficits are stable per son.  She feels well.  She has gained back the weight she lost when she was sick.   Tobacco use - she quit smoking completely 4 months ago.  Still has a slight smokers cough which is non productive.    Lab Results  Component Value Date   CREATININE 0.92 11/13/2020   BUN 16 11/13/2020   NA 144 11/13/2020   K 3.9 11/13/2020   CL 109 (H) 11/13/2020   CO2 19 (L) 11/13/2020   Lab Results  Component Value Date   CHOL 108 10/29/2020   HDL 27 (L) 10/29/2020   LDLCALC 53 10/29/2020   TRIG 166 (H) 10/29/2020   CHOLHDL 4.0 10/29/2020   Lab Results  Component Value Date   TSH 2.190 10/29/2020   Lab Results  Component Value Date   HGBA1C 5.9 (H) 10/29/2020   Lab Results  Component Value Date   WBC 6.9 11/04/2020   HGB 14.3 11/04/2020   HCT 38.8 11/04/2020   MCV 81.5 11/04/2020   PLT 174 11/04/2020   Lab Results  Component Value Date   ALT 35 11/03/2020   AST 36 11/03/2020   ALKPHOS 51 11/03/2020   BILITOT 1.2 11/03/2020     Review of Systems  Constitutional:  Positive for appetite change and unexpected weight change. Negative for chills, fatigue and fever.  Respiratory:  Positive for cough. Negative for chest tightness, shortness of breath and wheezing.   Cardiovascular:  Negative for chest pain and leg swelling.  Gastrointestinal:  Negative for constipation and diarrhea.  Musculoskeletal:  Positive for arthralgias (left ring finger swollen).  Neurological:  Negative for dizziness and  headaches.  Psychiatric/Behavioral:  Positive for decreased concentration. Negative for sleep disturbance. The patient is nervous/anxious.    Patient Active Problem List   Diagnosis Date Noted   Unintentional weight loss 11/03/2020   Pulmonary nodules 11/03/2020   Moderate protein-calorie malnutrition ([place]) 10/29/2020   Aortic atherosclerosis ([place]) 10/01/2020   Mild cognitive impairment with memory loss 12/23/2018   Nicotine dependence 06/19/2016   Adenomatous colon polyp 06/19/2015   Constipation 06/19/2015   Bilateral low back pain with left-sided sciatica 06/19/2015   H/O varicella 04/12/2015   Obstructive apnea 02/11/2015   History of malignant melanoma of skin 02/11/2015   Dyslipidemia 02/11/2015   Allergic state 02/11/2015   Essential (primary) hypertension 02/11/2015   Generalized anxiety disorder 02/11/2015   Insomnia, persistent 02/11/2015   CMC arthritis, thumb, degenerative 02/11/2015    Allergies  Allergen Reactions   Mirtazapine Other (See Comments)    Hyponatremia   Hctz [Hydrochlorothiazide] Other (See Comments)    hyponatremia    Past Surgical History:  Procedure Laterality Date   CARPAL TUNNEL RELEASE Left 2014   COLONOSCOPY  05/2013   serrated adenoma   LUMBAR [place]  2015    Social History   Tobacco Use   Smoking status: Former    Packs/day: 1.00    Years: 50.00    Pack years: 50.00  Types: Cigarettes    Start date: 08/03/1966    Quit date: 11/01/2020    Years since quitting: 0.3   Smokeless tobacco: Never   Tobacco comments:    smoking since age 47  1 pack daily   Vaping Use   Vaping Use: Never used  Substance Use Topics   Alcohol use: No    Alcohol/week: 0.0 standard drinks   Drug use: No     Medication list has been reviewed and updated.  Current Meds  Medication Sig   albuterol (PROVENTIL HFA;VENTOLIN HFA) 108 (90 Base) MCG/ACT inhaler TAKE 2 PUFFS 4 TIMES A DAY (Patient taking differently: as  needed. TAKE 2 PUFFS 4 TIMES A DAY)   amLODipine (NORVASC) 5 MG tablet TAKE 1 TABLET BY MOUTH EVERY DAY   atorvastatin (LIPITOR) 20 MG tablet TAKE 1 TABLET BY MOUTH DAILY AT 6 PM.   bisoprolol (ZEBETA) 10 MG tablet TAKE 1 TABLET BY MOUTH EVERY DAY   diazepam (VALIUM) 5 MG tablet TAKE 1 TABLET BY MOUTH TWICE A DAY (Patient taking differently: daily.)   donepezil (ARICEPT) 10 MG tablet TAKE 1 TABLET BY MOUTH EVERYDAY AT BEDTIME   fluticasone (FLONASE) 50 MCG/ACT nasal spray SPRAY 2 SPRAYS INTO EACH NOSTRIL EVERY DAY   losartan (COZAAR) 25 MG tablet TAKE 2 TABLETS BY MOUTH DAILY (REPLACES '50MG'$  TABLET)   memantine (NAMENDA) 5 MG tablet Take 5 mg by mouth 2 (two) times daily.   Multiple Vitamin (MULTIVITAMIN WITH MINERALS) TABS tablet Take 1 tablet by mouth daily.    PHQ 2/9 Scores 03/17/2021 11/13/2020 10/29/2020 05/13/2020  PHQ - 2 Score 0 0 0 0  PHQ- 9 Score 0 3 8 0    GAD 7 : Generalized Anxiety Score 03/17/2021 11/13/2020 10/29/2020 05/13/2020  Nervous, Anxious, on Edge 0 0 0 0  Control/stop worrying 0 0 0 0  Worry too much - different things 0 0 0 0  Trouble relaxing 0 0 0 0  Restless 0 0 0 0  Easily annoyed or irritable 0 0 0 0  Afraid - awful might happen 0 0 0 0  Total GAD 7 Score 0 0 0 0  Anxiety Difficulty - Not difficult at all Not difficult at all Not difficult at all    BP Readings from Last 3 Encounters:  03/17/21 102/76  11/13/20 92/64  11/05/20 110/67    Physical Exam Vitals and nursing note reviewed.  Constitutional:      General: She is not in acute distress.    Appearance: She is well-developed.  HENT:     Head: Normocephalic and atraumatic.  Neck:     Vascular: No carotid bruit.  Cardiovascular:     Rate and Rhythm: Normal rate and regular rhythm.     Pulses: Normal pulses.     Heart sounds: No murmur heard. Pulmonary:     Effort: Pulmonary effort is normal. No respiratory distress.     Breath sounds: No wheezing or rhonchi.  Musculoskeletal:     Cervical  back: Normal range of motion.     Right lower leg: No edema.     Left lower leg: No edema.  Lymphadenopathy:     Cervical: No cervical adenopathy.  Skin:    General: Skin is warm and dry.     Findings: No rash.  Neurological:     Mental Status: She is alert. Mental status is at baseline.  Psychiatric:        Mood and Affect: Mood normal.  Behavior: Behavior normal.    Wt Readings from Last 3 Encounters:  03/17/21 156 lb (70.8 kg)  11/13/20 120 lb 9.6 oz (54.7 kg)  11/04/20 116 lb 4.8 oz (52.8 kg)    BP 102/76   Pulse (!) 58   Temp 98.6 F (37 C) (Oral)   Ht '5\' 3"'$  (1.6 m)   Wt 156 lb (70.8 kg)   SpO2 95%   BMI 27.63 kg/m   Assessment and Plan: 1. Essential (primary) hypertension Clinically stable exam with well controlled BP. Tolerating medications without side effects at this time. Pt to continue current regimen and low sodium diet; benefits of regular exercise as able discussed.  2. Mild cognitive impairment with memory loss Stable impairment. Tolerating Namenda and Aricept daily Supportive environment  - lives with son Legrand [place].  3. Generalized anxiety disorder Mild, chronic sx treated with Diazepam daily as needed  4. Tobacco use disorder, severe, in early remission Pt is congratulated on quitting   Partially dictated using Editor, commissioning. Any errors are unintentional.  Halina Maidens, MD [facility]  03/17/2021

## 2021-04-07 ENCOUNTER — Telehealth: Payer: Self-pay | Admitting: Internal Medicine

## 2021-04-07 NOTE — Telephone Encounter (Signed)
Copied from [place] [id number]. Topic: Medicare AWV >> Apr 07, 2021 11:27 AM Cher Nakai R wrote: Reason for CRM:  Left message for patient to call back and schedule Medicare Annual Wellness Visit (AWV) in office.   If unable to come into the office for AWV,  please offer to do virtually or by telephone.  Last AWV:  09/28/2019  Please schedule at anytime with [facility] Health Advisor.  40 minute appointment  Any questions, please contact me at [phone]

## 2021-05-27 ENCOUNTER — Other Ambulatory Visit: Payer: Self-pay | Admitting: Internal Medicine

## 2021-05-27 DIAGNOSIS — G4700 Insomnia, unspecified: Secondary | ICD-10-CM

## 2021-05-27 NOTE — Telephone Encounter (Signed)
Requested medications are due for refill today.  yes  Requested medications are on the active medications list.  yes  Last refill. 03/14/2021  Future visit scheduled.   yes  Notes to clinic.  Medication not delegated. 

## 2021-07-09 ENCOUNTER — Other Ambulatory Visit: Payer: Self-pay | Admitting: Internal Medicine

## 2021-07-09 NOTE — Telephone Encounter (Signed)
Requested medication (s) are due for refill today: Yes  Requested medication (s) are on the active medication list: Yes  Last refill:  12/14/20 #180/0RF  Future visit scheduled: Yes  Notes to clinic:  Unable to refill per protocol due to failed labs, no updated results.      Requested Prescriptions  Pending Prescriptions Disp Refills   losartan (COZAAR) 25 MG tablet [Pharmacy Med Name: LOSARTAN POTASSIUM 25 MG TAB] 180 tablet 0    Sig: TAKE 2 TABLETS BY MOUTH DAILY (REPLACES 50MG  TABLET)     Cardiovascular:  Angiotensin Receptor Blockers Failed - 07/09/2021  3:00 PM      Failed - Cr in normal range and within 180 days    Creatinine, Ser  Date Value Ref Range Status  11/13/2020 0.92 0.57 - 1.00 mg/dL Final          Failed - K in normal range and within 180 days    Potassium  Date Value Ref Range Status  11/13/2020 3.9 3.5 - 5.2 mmol/L Final          Passed - Patient is not pregnant      Passed - Last BP in normal range    BP Readings from Last 1 Encounters:  03/17/21 102/76          Passed - Valid encounter within last 6 months    Recent Outpatient Visits           3 months ago Essential (primary) hypertension   [facility] Glean Hess, MD   7 months ago Hyponatremia   [facility] Glean Hess, MD   8 months ago Weight loss, unintentional   [facility] Glean Hess, MD   1 year ago Essential (primary) hypertension   [facility] Glean Hess, MD   1 year ago Memory difficulties   [facility] Glean Hess, MD       Future Appointments             In 1 month [place] Melia, Jesse Sans, MD [facility], [facility]

## 2021-07-11 ENCOUNTER — Telehealth: Payer: Self-pay | Admitting: Internal Medicine

## 2021-07-11 NOTE — Telephone Encounter (Signed)
Copied from [place] [phone]. Topic: Medicare AWV >> Jul 11, 2021 11:12 AM Cher Nakai R wrote: Reason for CRM:  Left message for patient to call back and schedule Medicare Annual Wellness Visit (AWV) in office.   If unable to come into the office for AWV,  please offer to do virtually or by telephone.  Last AWV: 09/28/2019  Please schedule at anytime with [facility] Health Advisor.      40 Minutes appointment   Any questions, please call me at [phone]

## 2021-07-14 ENCOUNTER — Telehealth: Payer: Self-pay | Admitting: Internal Medicine

## 2021-07-14 NOTE — Telephone Encounter (Signed)
Copied from [place] [id number]. Topic: Medicare AWV >> Jul 14, 2021 10:45 AM Cher Nakai R wrote: Reason for CRM:  Left message for patient to call back and schedule Medicare Annual Wellness Visit (AWV) in office.   If unable to come into the office for AWV,  please offer to do virtually or by telephone.  Last AWV: 09/28/2019  Please schedule at anytime with [facility] Health Advisor.      40 Minutes appointment   Any questions, please call me at [phone]

## 2021-08-11 ENCOUNTER — Encounter: Payer: Medicare Other | Admitting: Internal Medicine

## 2021-08-11 ENCOUNTER — Telehealth: Payer: Self-pay

## 2021-08-11 NOTE — Progress Notes (Deleted)
Patient: Casey Lara, Female    DOB: Sep 16, 1950, 71 y.o.   MRN: [id number] Visit Date: 08/11/2021  Today's Provider: Halina Maidens, MD   No chief complaint on file.  Subjective:   Annual wellness visit Casey Lara is a 71 y.o. female who presents today for her Subsequent Annual Wellness Visit. She feels {DESC; WELL/FAIRLY WELL/POORLY:18703}. She reports exercising ***. She reports she is sleeping {DESC; WELL/FAIRLY WELL/POORLY:18703}.   -----------------------------------------------------------  Mammogram: 09/2020 Colonoscopy 05/2013 DEXA 08/2008 Hypertension This is a chronic problem. The problem is controlled. Pertinent negatives include no chest pain, headaches, palpitations or shortness of breath. Past treatments include beta blockers, angiotensin blockers and calcium channel blockers.  Hyperlipidemia This is a chronic problem. The problem is controlled. Pertinent negatives include no chest pain or shortness of breath. Current antihyperlipidemic treatment includes statins.   Lab Results  Component Value Date   CREATININE 0.92 11/13/2020   BUN 16 11/13/2020   NA 144 11/13/2020   K 3.9 11/13/2020   CL 109 (H) 11/13/2020   CO2 19 (L) 11/13/2020   Lab Results  Component Value Date   CHOL 108 10/29/2020   HDL 27 (L) 10/29/2020   LDLCALC 53 10/29/2020   TRIG 166 (H) 10/29/2020   CHOLHDL 4.0 10/29/2020   Lab Results  Component Value Date   TSH 2.190 10/29/2020   Lab Results  Component Value Date   HGBA1C 5.9 (H) 10/29/2020   Lab Results  Component Value Date   WBC 6.9 11/04/2020   HGB 14.3 11/04/2020   HCT 38.8 11/04/2020   MCV 81.5 11/04/2020   PLT 174 11/04/2020   Lab Results  Component Value Date   ALT 35 11/03/2020   AST 36 11/03/2020   ALKPHOS 51 11/03/2020   BILITOT 1.2 11/03/2020    Review of Systems  Constitutional:  Negative for chills, fatigue and fever.  HENT:  Negative for congestion, hearing loss, tinnitus, trouble  swallowing and voice change.   Eyes:  Negative for visual disturbance.  Respiratory:  Negative for cough, chest tightness, shortness of breath and wheezing.   Cardiovascular:  Negative for chest pain, palpitations and leg swelling.  Gastrointestinal:  Negative for abdominal pain, constipation, diarrhea and vomiting.  Endocrine: Negative for polydipsia and polyuria.  Genitourinary:  Negative for dysuria, frequency, genital sores, vaginal bleeding and vaginal discharge.  Musculoskeletal:  Negative for arthralgias, gait problem and joint swelling.  Skin:  Negative for color change and rash.  Neurological:  Negative for dizziness, tremors, light-headedness and headaches.  Hematological:  Negative for adenopathy. Does not bruise/bleed easily.  Psychiatric/Behavioral:  Negative for dysphoric mood and sleep disturbance. The patient is not nervous/anxious.    Social History   Socioeconomic History   Marital status: Widowed    Spouse name: Not on file   Number of children: Not on file   Years of education: Not on file   Highest education level: Not on file  Occupational History   Not on file  Tobacco Use   Smoking status: Former    Packs/day: 1.00    Years: 50.00    Pack years: 50.00    Types: Cigarettes    Start date: 08/03/1966    Quit date: 11/01/2020    Years since quitting: 0.7   Smokeless tobacco: Never   Tobacco comments:    smoking since age 66  1 pack daily   Vaping Use   Vaping Use: Never used  Substance and Sexual Activity   Alcohol use: No  Alcohol/week: 0.0 standard drinks   Drug use: No   Sexual activity: Not on file  Other Topics Concern   Not on file  Social History Narrative   Not on file   Social Determinants of Health   Financial Resource Strain: Not on file  Food Insecurity: Not on file  Transportation Needs: Not on file  Physical Activity: Not on file  Stress: Not on file  Social Connections: Not on file  Intimate Partner Violence: Not on file     Patient Active Problem List   Diagnosis Date Noted   Pulmonary nodules 11/03/2020   Aortic atherosclerosis ([place]) 10/01/2020   Mild cognitive impairment with memory loss 12/23/2018   Tobacco use disorder, severe, in early remission 06/19/2016   Adenomatous colon polyp 06/19/2015   Constipation 06/19/2015   Bilateral low back pain with left-sided sciatica 06/19/2015   H/O varicella 04/12/2015   Obstructive apnea 02/11/2015   History of malignant melanoma of skin 02/11/2015   Dyslipidemia 02/11/2015   Allergic state 02/11/2015   Essential (primary) hypertension 02/11/2015   Generalized anxiety disorder 02/11/2015   Insomnia, persistent 02/11/2015   CMC arthritis, thumb, degenerative 02/11/2015    Past Surgical History:  Procedure Laterality Date   CARPAL TUNNEL RELEASE Left 2014   COLONOSCOPY  05/2013   serrated adenoma   LUMBAR [place]  2015    Her family history includes Breast cancer in her cousin, maternal grandmother, and paternal grandmother; Breast cancer (age of onset: 109) in her mother and sister; Cancer in her father; Stroke in her mother.     No outpatient medications have been marked as taking for the 08/11/21 encounter (Appointment) with Glean Hess, MD.    Patient Care Team: Glean Hess, MD as PCP - General (Internal Medicine) Hessie Knows, MD as Consulting Physician (Orthopedic Surgery) Josefine Class, MD as Referring Physician (Gastroenterology) Ree Edman, MD (Dermatology)          Wt Readings from Last 3 Encounters:  03/17/21 156 lb (70.8 kg)  11/13/20 120 lb 9.6 oz (54.7 kg)  11/04/20 116 lb 4.8 oz (52.8 kg)    Vitals: There were no vitals taken for this visit.  Physical Exam Vitals and nursing note reviewed.  Constitutional:      General: She is not in acute distress.    Appearance: She is well-developed.  HENT:     Head: Normocephalic and atraumatic.     Right Ear: Tympanic membrane  and ear canal normal.     Left Ear: Tympanic membrane and ear canal normal.     Nose:     Right Sinus: No maxillary sinus tenderness.     Left Sinus: No maxillary sinus tenderness.  Eyes:     General: No scleral icterus.       Right eye: No discharge.        Left eye: No discharge.     Conjunctiva/sclera: Conjunctivae normal.  Neck:     Thyroid: No thyromegaly.     Vascular: No carotid bruit.  Cardiovascular:     Rate and Rhythm: Normal rate and regular rhythm.     Pulses: Normal pulses.     Heart sounds: Normal heart sounds.  Pulmonary:     Effort: Pulmonary effort is normal. No respiratory distress.     Breath sounds: No wheezing.  Chest:  Breasts:    Right: No mass, nipple discharge, skin change or tenderness.     Left: No mass,  nipple discharge, skin change or tenderness.  Abdominal:     General: Bowel sounds are normal.     Palpations: Abdomen is soft.     Tenderness: There is no abdominal tenderness.  Musculoskeletal:     Cervical back: Normal range of motion. No erythema.     Right lower leg: No edema.     Left lower leg: No edema.  Lymphadenopathy:     Cervical: No cervical adenopathy.  Skin:    General: Skin is warm and dry.     Findings: No rash.  Neurological:     Mental Status: She is alert and oriented to person, place, and time.     Cranial Nerves: No cranial nerve deficit.     Sensory: No sensory deficit.     Deep Tendon Reflexes: Reflexes are normal and symmetric.  Psychiatric:        Attention and Perception: Attention normal.        Mood and Affect: Mood normal.    Activities of Daily Living In your present state of health, do you have any difficulty performing the following activities: 03/17/2021 11/04/2020  Hearing? N N  Vision? Y N  Difficulty concentrating or making decisions? Tempie Donning  Walking or climbing stairs? N Y  Dressing or bathing? N N  Doing errands, shopping? Y N  Some recent data might be hidden    Fall Risk Assessment Fall Risk   03/17/2021 11/13/2020 10/29/2020 05/13/2020 01/12/2020  Falls in the past year? 0 0 0 0 0  Number falls in past yr: 0 0 0 0 0  Injury with Fall? 0 0 0 0 0  Risk for fall due to : No Fall Risks No Fall Risks - No Fall Risks No Fall Risks  Follow up Falls evaluation completed Falls evaluation completed Falls evaluation completed Falls evaluation completed Falls evaluation completed     Depression Screen PHQ 2/9 Scores 03/17/2021 11/13/2020 10/29/2020 05/13/2020  PHQ - 2 Score 0 0 0 0  PHQ- 9 Score 0 3 8 0    6CIT Screen 05/13/2020 01/12/2020 09/28/2019 08/22/2018  What Year? 4 points 4 points 4 points 4 points  What month? 3 points 0 points 3 points 0 points  What time? 0 points 3 points 0 points 0 points  Count back from 20 0 points 4 points 4 points 0 points  Months in reverse 4 points 4 points 4 points 2 points  Repeat phrase 6 points 2 points 6 points 4 points  Total Score 17 17 21 10     Medicare Annual Wellness Visit Summary:  Reviewed patient's Family Medical History Reviewed and updated list of patient's medical providers Assessment of cognitive impairment was done Assessed patient's functional ability Established a written schedule for health screening [place] Completed and Reviewed  Exercise Activities and Dietary recommendations  Goals   None     Immunization History  Administered Date(s) Administered   Fluad Quad(high Dose 65+) 05/13/2020   Influenza, High Dose Seasonal PF 06/14/2018   Influenza,inj,Quad PF,6+ Mos 06/19/2015, 06/19/2016, 08/19/2017   PFIZER(Purple Top)SARS-COV-2 Vaccination 09/29/2019, 10/31/2019, 08/16/2020   Pneumococcal Conjugate-13 06/19/2016   Pneumococcal Polysaccharide-23 05/29/2013, 08/22/2018   Tdap 01/17/2014    Health Maintenance  Topic Date Due   Zoster Vaccines- Shingrix (1 of 2) Never done   COVID-19 Vaccine (4 - Booster for Pfizer series) 10/11/2020   INFLUENZA VACCINE  03/03/2021   MAMMOGRAM  09/18/2021    COLONOSCOPY (Pts 45-65yrs Insurance coverage will need to be  confirmed)  05/24/2023   TETANUS/TDAP  01/18/2024   Pneumonia Vaccine 36+ Years old  Completed   DEXA SCAN  Completed   Hepatitis C Screening  Completed   HPV VACCINES  Aged Out    Discussed health benefits of physical activity, and encouraged her to engage in regular exercise appropriate for her age and condition.    ------------------------------------------------------------------------------------------------------------  Assessment & Plan:     There are no diagnoses linked to this encounter.

## 2021-08-11 NOTE — Telephone Encounter (Signed)
Left voice mail to have patient reschedule physical

## 2021-08-25 ENCOUNTER — Other Ambulatory Visit: Payer: Self-pay | Admitting: Internal Medicine

## 2021-08-25 NOTE — Telephone Encounter (Signed)
prescription for

## 2021-09-04 ENCOUNTER — Other Ambulatory Visit: Payer: Self-pay | Admitting: Internal Medicine

## 2021-09-04 DIAGNOSIS — R413 Other amnesia: Secondary | ICD-10-CM

## 2021-09-04 NOTE — Telephone Encounter (Signed)
Requested medication (s) are due for refill today: historical provider   Requested medication (s) are on the active medication list: yes  Last refill:  na   Future visit scheduled: no  Notes to clinic:  historical provider. Do you want to renew Rx?     Requested Prescriptions  Pending Prescriptions Disp Refills   memantine (NAMENDA) 5 MG tablet [Pharmacy Med Name: MEMANTINE HCL 5 MG TABLET] 180 tablet     Sig: TAKE 1 TABLET BY MOUTH TWICE A DAY     Neurology:  Alzheimer's Agents 2 Passed - 09/04/2021  2:17 AM      Passed - Cr in normal range and within 360 days    Creatinine, Ser  Date Value Ref Range Status  11/13/2020 0.92 0.57 - 1.00 mg/dL Final          Passed - eGFR is 5 or above and within 360 days    GFR calc Af Amer  Date Value Ref Range Status  09/28/2019 92 >59 mL/min/1.73 Final   GFR, Estimated  Date Value Ref Range Status  11/05/2020 >60 >60 mL/min Final    Comment:    (NOTE) Calculated using the CKD-EPI Creatinine Equation (2021)    eGFR  Date Value Ref Range Status  11/13/2020 67 >59 mL/min/1.73 Final          Passed - Valid encounter within last 6 months    Recent Outpatient Visits           5 months ago Essential (primary) hypertension   [facility] Glean Hess, MD   9 months ago Hyponatremia   [facility] Glean Hess, MD   10 months ago Weight loss, unintentional   [facility] Glean Hess, MD   1 year ago Essential (primary) hypertension   [facility] Glean Hess, MD   1 year ago Memory difficulties   [facility] Glean Hess, MD

## 2021-09-16 ENCOUNTER — Other Ambulatory Visit: Payer: Self-pay | Admitting: Internal Medicine

## 2021-09-16 DIAGNOSIS — G4700 Insomnia, unspecified: Secondary | ICD-10-CM

## 2021-09-17 NOTE — Telephone Encounter (Signed)
Please review. Last office visit 03/17/2021.  KP

## 2021-09-17 NOTE — Telephone Encounter (Signed)
No showed for annual wellness in January.

## 2021-09-17 NOTE — Telephone Encounter (Signed)
Requested medication (s) are due for refill today: Yes  Requested medication (s) are on the active medication list: Yes  Last refill:  05/28/21  Future visit scheduled: No. Needs appt.  Notes to clinic:  See request.    Requested Prescriptions  Pending Prescriptions Disp Refills   diazepam (VALIUM) 5 MG tablet [Pharmacy Med Name: DIAZEPAM 5 MG TABLET] 30 tablet 2    Sig: TAKE 1 TABLET BY MOUTH DAILY AS NEEDED.     Not Delegated - Psychiatry: Anxiolytics/Hypnotics 2 Failed - 09/16/2021  7:52 PM      Failed - This refill cannot be delegated      Failed - Urine Drug Screen completed in last 360 days      Failed - Valid encounter within last 6 months    Recent Outpatient Visits           6 months ago Essential (primary) hypertension   [facility] Glean Hess, MD   10 months ago Hyponatremia   [facility] Glean Hess, MD   10 months ago Weight loss, unintentional   [facility] Glean Hess, MD   1 year ago Essential (primary) hypertension   [facility] Glean Hess, MD   1 year ago Memory difficulties   [facility] Glean Hess, MD              Passed - Patient is not pregnant

## 2021-09-18 ENCOUNTER — Other Ambulatory Visit: Payer: Self-pay | Admitting: Internal Medicine

## 2021-09-18 DIAGNOSIS — G4700 Insomnia, unspecified: Secondary | ICD-10-CM

## 2021-09-19 NOTE — Telephone Encounter (Signed)
Patient called and notified follow-up appt needed. Left message on voicemail.

## 2021-09-24 ENCOUNTER — Other Ambulatory Visit: Payer: Self-pay | Admitting: Internal Medicine

## 2021-09-24 DIAGNOSIS — G4700 Insomnia, unspecified: Secondary | ICD-10-CM

## 2021-09-24 NOTE — Telephone Encounter (Signed)
Requested medication (s) are due for refill today: Yes  Requested medication (s) are on the active medication list: Yes  Last refill:  05/28/21  Future visit scheduled: No  Notes to clinic:  Left message to call and make appointment.    Requested Prescriptions  Pending Prescriptions Disp Refills   diazepam (VALIUM) 5 MG tablet [Pharmacy Med Name: DIAZEPAM 5 MG TABLET] 30 tablet 2    Sig: TAKE 1 TABLET BY MOUTH DAILY AS NEEDED.     Not Delegated - Psychiatry: Anxiolytics/Hypnotics 2 Failed - 09/24/2021 11:00 AM      Failed - This refill cannot be delegated      Failed - Urine Drug Screen completed in last 360 days      Failed - Valid encounter within last 6 months    Recent Outpatient Visits           6 months ago Essential (primary) hypertension   [facility] Glean Hess, MD   10 months ago Hyponatremia   [facility] Glean Hess, MD   11 months ago Weight loss, unintentional   [facility] Glean Hess, MD   1 year ago Essential (primary) hypertension   [facility] Glean Hess, MD   1 year ago Memory difficulties   [facility] Glean Hess, MD              Passed - Patient is not pregnant

## 2021-09-25 ENCOUNTER — Other Ambulatory Visit: Payer: Self-pay | Admitting: Internal Medicine

## 2021-09-25 ENCOUNTER — Telehealth: Payer: Self-pay

## 2021-09-25 DIAGNOSIS — G4700 Insomnia, unspecified: Secondary | ICD-10-CM

## 2021-09-25 NOTE — Telephone Encounter (Signed)
Pt's son called and scheduled an appt but states mom has been out of her medication for a couple of days now and she needs to get it refilled today

## 2021-09-25 NOTE — Telephone Encounter (Signed)
If the son calls back please transfer him to the office to set up the appointment.

## 2021-09-25 NOTE — Telephone Encounter (Signed)
Please review for rescheduling

## 2021-09-25 NOTE — Telephone Encounter (Signed)
Copied from [place] [id number]. Topic: General - Other >> Sep 24, 2021  4:27 PM McGill, Nelva Bush wrote: Reason for CRM: Pt son Legrand Como called in to reschedule missed Physical/AWV on 08/11/2021. Epic populated the appointment as an AWV and would not allow me to schedule it as a physical.   Pt' son asked for a callback to reschedule.   LVM to reschedule missed app.

## 2021-09-26 NOTE — Telephone Encounter (Signed)
Requested medications are due for refill today.  yes  Requested medications are on the active medications list.  yes  Last refill. 05/28/2021  Future visit scheduled.   yes  Notes to clinic.  Medication not delegated.    Requested Prescriptions  Pending Prescriptions Disp Refills   diazepam (VALIUM) 5 MG tablet [Pharmacy Med Name: DIAZEPAM 5 MG TABLET] 30 tablet 2    Sig: TAKE 1 TABLET BY MOUTH DAILY AS NEEDED.     Not Delegated - Psychiatry: Anxiolytics/Hypnotics 2 Failed - 09/25/2021  6:25 PM      Failed - This refill cannot be delegated      Failed - Urine Drug Screen completed in last 360 days      Failed - Valid encounter within last 6 months    Recent Outpatient Visits           6 months ago Essential (primary) hypertension   [facility] Glean Hess, MD   10 months ago Hyponatremia   [facility] Glean Hess, MD   11 months ago Weight loss, unintentional   [facility] Glean Hess, MD   1 year ago Essential (primary) hypertension   [facility] Glean Hess, MD   1 year ago Memory difficulties   [facility] Glean Hess, MD       Future Appointments             In 4 days Glean Hess, MD [facility] [place] - Patient is not pregnant

## 2021-09-30 ENCOUNTER — Other Ambulatory Visit: Payer: Self-pay

## 2021-09-30 ENCOUNTER — Encounter: Payer: Self-pay | Admitting: Internal Medicine

## 2021-09-30 ENCOUNTER — Ambulatory Visit (INDEPENDENT_AMBULATORY_CARE_PROVIDER_SITE_OTHER): Payer: Medicare Other | Admitting: Internal Medicine

## 2021-09-30 VITALS — BP 116/82 | HR 60 | Ht 63.0 in | Wt 161.0 lb

## 2021-09-30 DIAGNOSIS — R413 Other amnesia: Secondary | ICD-10-CM

## 2021-09-30 DIAGNOSIS — E785 Hyperlipidemia, unspecified: Secondary | ICD-10-CM | POA: Diagnosis not present

## 2021-09-30 DIAGNOSIS — G3184 Mild cognitive impairment, so stated: Secondary | ICD-10-CM

## 2021-09-30 DIAGNOSIS — I700 Atherosclerosis of aorta: Secondary | ICD-10-CM | POA: Diagnosis not present

## 2021-09-30 DIAGNOSIS — G4700 Insomnia, unspecified: Secondary | ICD-10-CM

## 2021-09-30 DIAGNOSIS — R7303 Prediabetes: Secondary | ICD-10-CM

## 2021-09-30 DIAGNOSIS — Z1231 Encounter for screening mammogram for malignant neoplasm of breast: Secondary | ICD-10-CM | POA: Diagnosis not present

## 2021-09-30 DIAGNOSIS — I10 Essential (primary) hypertension: Secondary | ICD-10-CM | POA: Diagnosis not present

## 2021-09-30 MED ORDER — DIAZEPAM 5 MG PO TABS: 5.0000 mg | ORAL_TABLET | Freq: Every day | ORAL | 3 refills | Status: DC | PRN

## 2021-09-30 NOTE — Progress Notes (Signed)
Date:  09/30/2021   Name:  Casey Lara   DOB:  02-11-1951   MRN:  [id number]   Chief Complaint: Hypertension and Hyperlipidemia  Hypertension This is a chronic problem. The problem is controlled. Pertinent negatives include no shortness of breath. Past treatments include angiotensin blockers, calcium channel blockers and beta blockers. The current treatment provides significant improvement. There is no history of kidney disease, CAD/MI or CVA. There is no history of chronic renal disease.  Hyperlipidemia This is a chronic problem. The problem is controlled. She has no history of chronic renal disease, diabetes or hypothyroidism. Pertinent negatives include no shortness of breath. Current antihyperlipidemic treatment includes statins.  MCI with memory loss - at last visit 6 mo ago her symptoms were stable.  She was taking Aricept and Namenda without side effects. She is not seeing Neurology.  She continues on low dose Valium at hs.  She tends to stay up late and then sleeps late during the day.  Lab Results  Component Value Date   NA 144 11/13/2020   K 3.9 11/13/2020   CO2 19 (L) 11/13/2020   GLUCOSE 193 (H) 11/13/2020   BUN 16 11/13/2020   CREATININE 0.92 11/13/2020   CALCIUM 8.8 11/13/2020   EGFR 67 11/13/2020   GFRNONAA >60 11/05/2020   Lab Results  Component Value Date   CHOL 108 10/29/2020   HDL 27 (L) 10/29/2020   LDLCALC 53 10/29/2020   TRIG 166 (H) 10/29/2020   CHOLHDL 4.0 10/29/2020   Lab Results  Component Value Date   TSH 2.190 10/29/2020   Lab Results  Component Value Date   HGBA1C 5.9 (H) 10/29/2020   Lab Results  Component Value Date   WBC 6.9 11/04/2020   HGB 14.3 11/04/2020   HCT 38.8 11/04/2020   MCV 81.5 11/04/2020   PLT 174 11/04/2020   Lab Results  Component Value Date   ALT 35 11/03/2020   AST 36 11/03/2020   ALKPHOS 51 11/03/2020   BILITOT 1.2 11/03/2020   No results found for: 25OHVITD2, 25OHVITD3, VD25OH   Review of Systems   Constitutional:  Positive for unexpected weight change (gained about 5 lbs since last visit). Negative for chills, fatigue and fever.  Respiratory:  Positive for cough. Negative for chest tightness, shortness of breath and wheezing.   Gastrointestinal:  Negative for abdominal pain and constipation.  Musculoskeletal:  Negative for arthralgias and gait problem.  Skin:  Negative for color change and rash.  Psychiatric/Behavioral:  Positive for decreased concentration and sleep disturbance. Negative for agitation and dysphoric mood. The patient is not nervous/anxious.    Patient Active Problem List   Diagnosis Date Noted   Pulmonary nodules 11/03/2020   Aortic atherosclerosis ([place]) 10/01/2020   Mild cognitive impairment with memory loss 12/23/2018   Tobacco use disorder, severe, in early remission 06/19/2016   Adenomatous colon polyp 06/19/2015   Constipation 06/19/2015   Bilateral low back pain with left-sided sciatica 06/19/2015   H/O varicella 04/12/2015   Obstructive apnea 02/11/2015   History of malignant melanoma of skin 02/11/2015   Dyslipidemia 02/11/2015   Allergic state 02/11/2015   Essential (primary) hypertension 02/11/2015   Generalized anxiety disorder 02/11/2015   Insomnia, persistent 02/11/2015   CMC arthritis, thumb, degenerative 02/11/2015    Allergies  Allergen Reactions   Mirtazapine Other (See Comments)    Hyponatremia   Hctz [Hydrochlorothiazide] Other (See Comments)    hyponatremia    Past Surgical History:  Procedure Laterality Date  CARPAL TUNNEL RELEASE Left 2014   COLONOSCOPY  05/2013   serrated adenoma   LUMBAR DISC SURGERY     MELANOMA EXCISION  2015    Social History   Tobacco Use   Smoking status: Former    Packs/day: 1.00    Years: 50.00    Pack years: 50.00    Types: Cigarettes    Start date: 08/03/1966    Quit date: 11/01/2020    Years since quitting: 0.9   Smokeless tobacco: Never   Tobacco comments:    smoking since age 61  1  pack daily   Vaping Use   Vaping Use: Never used  Substance Use Topics   Alcohol use: No    Alcohol/week: 0.0 standard drinks   Drug use: No     Medication list has been reviewed and updated.  Current Meds  Medication Sig   albuterol (PROVENTIL HFA;VENTOLIN HFA) 108 (90 Base) MCG/ACT inhaler TAKE 2 PUFFS 4 TIMES A DAY (Patient taking differently: as needed. TAKE 2 PUFFS 4 TIMES A DAY)   amLODipine (NORVASC) 5 MG tablet TAKE 1 TABLET BY MOUTH EVERY DAY   atorvastatin (LIPITOR) 20 MG tablet TAKE 1 TABLET BY MOUTH DAILY AT 6 PM.   bisoprolol (ZEBETA) 10 MG tablet TAKE 1 TABLET BY MOUTH EVERY DAY   diazepam (VALIUM) 5 MG tablet Take 1 tablet (5 mg total) by mouth daily as needed.   donepezil (ARICEPT) 10 MG tablet TAKE 1 TABLET BY MOUTH EVERYDAY AT BEDTIME   fluticasone (FLONASE) 50 MCG/ACT nasal spray SPRAY 2 SPRAYS INTO EACH NOSTRIL EVERY DAY   losartan (COZAAR) 25 MG tablet TAKE 2 TABLETS BY MOUTH DAILY (REPLACES 50MG TABLET)   memantine (NAMENDA) 5 MG tablet Take 5 mg by mouth 2 (two) times daily.   Multiple Vitamin (MULTIVITAMIN WITH MINERALS) TABS tablet Take 1 tablet by mouth daily.    PHQ 2/9 Scores 09/30/2021 03/17/2021 11/13/2020 10/29/2020  PHQ - 2 Score 4 0 0 0  PHQ- 9 Score 8 0 3 8    GAD 7 : Generalized Anxiety Score 09/30/2021 03/17/2021 11/13/2020 10/29/2020  Nervous, Anxious, on Edge 0 0 0 0  Control/stop worrying 0 0 0 0  Worry too much - different things 0 0 0 0  Trouble relaxing 0 0 0 0  Restless 0 0 0 0  Easily annoyed or irritable 0 0 0 0  Afraid - awful might happen 0 0 0 0  Total GAD 7 Score 0 0 0 0  Anxiety Difficulty Not difficult at all - Not difficult at all Not difficult at all    BP Readings from Last 3 Encounters:  09/30/21 116/82  03/17/21 102/76  11/13/20 92/64    Physical Exam Constitutional:      Appearance: Normal appearance.  Neck:     Vascular: No carotid bruit.  Cardiovascular:     Rate and Rhythm: Normal rate and regular rhythm.      Pulses: Normal pulses.     Heart sounds: No murmur heard. Pulmonary:     Effort: Pulmonary effort is normal.     Breath sounds: No wheezing or rhonchi.  Musculoskeletal:     Cervical back: Normal range of motion.     Right lower leg: No edema.     Left lower leg: No edema.  Lymphadenopathy:     Cervical: No cervical adenopathy.  Skin:    General: Skin is warm and dry.     Capillary Refill: Capillary refill takes less than 2  seconds.  Neurological:     Mental Status: She is alert.  Psychiatric:        Mood and Affect: Mood normal.    Wt Readings from Last 3 Encounters:  09/30/21 161 lb (73 kg)  03/17/21 156 lb (70.8 kg)  11/13/20 120 lb 9.6 oz (54.7 kg)    BP 116/82    Pulse 60    Ht '5\' 3"'  (1.6 m)    Wt 161 lb (73 kg)    SpO2 91%    BMI 28.52 kg/m   Assessment and Plan: 1. Essential (primary) hypertension Clinically stable exam with well controlled BP. Tolerating medications without side effects at this time. Pt to continue current regimen and low sodium diet; benefits of regular exercise as able discussed. - CBC with Differential/Platelet - Comprehensive metabolic panel - TSH  2. Mild cognitive impairment with memory loss Symptoms stable on Aricept and Namenda. She is not seeing Neurology.  3. Dyslipidemia Tolerating statin medication without side effects at this time LDL is at goal of < 70 on current dose Continue same therapy without change at this time. - Lipid panel  4. Aortic atherosclerosis (HCC) Continue statin therapy  5. Prediabetes Cautioned against further weight gain. Low carb food choices discussed - Hemoglobin A1c  6. Insomnia, persistent Continue low dose valium  - diazepam (VALIUM) 5 MG tablet; Take 1 tablet (5 mg total) by mouth daily as needed.  Dispense: 30 tablet; Refill: 3  7. Encounter for screening mammogram for breast cancer Schedule at [place]   Partially dictated using Editor, commissioning. Any errors  are unintentional.  Halina Maidens, MD [facility]  09/30/2021

## 2021-10-01 LAB — COMPREHENSIVE METABOLIC PANEL
ALT: 25 IU/L (ref 0–32)
AST: 28 IU/L (ref 0–40)
Albumin/Globulin Ratio: 1.1 — ABNORMAL LOW (ref 1.2–2.2)
Albumin: 4.1 g/dL (ref 3.8–4.8)
Alkaline Phosphatase: 96 IU/L (ref 44–121)
BUN/Creatinine Ratio: 14 (ref 12–28)
BUN: 18 mg/dL (ref 8–27)
Bilirubin Total: 0.8 mg/dL (ref 0.0–1.2)
CO2: 19 mmol/L — ABNORMAL LOW (ref 20–29)
Calcium: 9.3 mg/dL (ref 8.7–10.3)
Chloride: 102 mmol/L (ref 96–106)
Creatinine, Ser: 1.31 mg/dL — ABNORMAL HIGH (ref 0.57–1.00)
Globulin, Total: 3.6 g/dL (ref 1.5–4.5)
Glucose: 126 mg/dL — ABNORMAL HIGH (ref 70–99)
Potassium: 4.2 mmol/L (ref 3.5–5.2)
Sodium: 138 mmol/L (ref 134–144)
Total Protein: 7.7 g/dL (ref 6.0–8.5)
eGFR: 44 mL/min/{1.73_m2} — ABNORMAL LOW (ref >59)

## 2021-10-01 LAB — CBC WITH DIFFERENTIAL/PLATELET
Basophils Absolute: 0 10*3/uL (ref 0.0–0.2)
Basos: 1 %
EOS (ABSOLUTE): 0.1 10*3/uL (ref 0.0–0.4)
Eos: 2 %
Hematocrit: 43.5 % (ref 34.0–46.6)
Hemoglobin: 15.3 g/dL (ref 11.1–15.9)
Immature Grans (Abs): 0 10*3/uL (ref 0.0–0.1)
Immature Granulocytes: 0 %
Lymphocytes Absolute: 1.8 10*3/uL (ref 0.7–3.1)
Lymphs: 29 %
MCH: 29.9 pg (ref 26.6–33.0)
MCHC: 35.2 g/dL (ref 31.5–35.7)
MCV: 85 fL (ref 79–97)
Monocytes Absolute: 0.5 10*3/uL (ref 0.1–0.9)
Monocytes: 7 %
Neutrophils Absolute: 3.7 10*3/uL (ref 1.4–7.0)
Neutrophils: 61 %
Platelets: 274 10*3/uL (ref 150–450)
RBC: 5.12 x10E6/uL (ref 3.77–5.28)
RDW: 12.6 % (ref 11.7–15.4)
WBC: 6.2 10*3/uL (ref 3.4–10.8)

## 2021-10-01 LAB — LIPID PANEL
Chol/HDL Ratio: 9 ratio — ABNORMAL HIGH (ref 0.0–4.4)
Cholesterol, Total: 206 mg/dL — ABNORMAL HIGH (ref 100–199)
HDL: 23 mg/dL — ABNORMAL LOW (ref >39)
LDL Chol Calc (NIH): 129 mg/dL — ABNORMAL HIGH (ref 0–99)
Triglycerides: 299 mg/dL — ABNORMAL HIGH (ref 0–149)
VLDL Cholesterol Cal: 54 mg/dL — ABNORMAL HIGH (ref 5–40)

## 2021-10-01 LAB — TSH: TSH: 3.08 u[IU]/mL (ref 0.450–4.500)

## 2021-10-01 LAB — HEMOGLOBIN A1C
Est. average glucose Bld gHb Est-mCnc: 134 mg/dL
Hgb A1c MFr Bld: 6.3 % — ABNORMAL HIGH (ref 4.8–5.6)

## 2021-10-03 NOTE — Progress Notes (Signed)
Called pt left VM to call back. ? ?[facility] nurse may give results to patient if they return call to clinic, a CRM has been created. ? ?KP

## 2021-10-22 ENCOUNTER — Other Ambulatory Visit: Payer: Self-pay | Admitting: Internal Medicine

## 2021-10-22 DIAGNOSIS — I10 Essential (primary) hypertension: Secondary | ICD-10-CM

## 2021-10-22 MED ORDER — AMLODIPINE BESYLATE 5 MG PO TABS: 5.0000 mg | ORAL_TABLET | Freq: Every day | ORAL | 1 refills | Status: DC

## 2021-10-22 MED ORDER — MEMANTINE HCL 5 MG PO TABS: 5.0000 mg | ORAL_TABLET | Freq: Two times a day (BID) | ORAL | 1 refills | Status: DC

## 2021-10-28 ENCOUNTER — Other Ambulatory Visit: Payer: Self-pay | Admitting: Internal Medicine

## 2021-10-29 NOTE — Telephone Encounter (Signed)
Requested Prescriptions  ?Pending Prescriptions Disp Refills  ?? losartan (COZAAR) 25 MG tablet [Pharmacy Med Name: LOSARTAN POTASSIUM 25 MG TAB] 180 tablet 0  ?  Sig: TAKE 2 TABLETS BY MOUTH DAILY (REPLACES '50MG'$  TABLET)  ?  ? Cardiovascular:  Angiotensin Receptor Blockers Failed - 10/28/2021 11:31 AM  ?  ?  Failed - Cr in normal range and within 180 days  ?  Creatinine, Ser  ?Date Value Ref Range Status  ?09/30/2021 1.31 (H) 0.57 - 1.00 mg/dL Final  ?   ?  ?  Passed - K in normal range and within 180 days  ?  Potassium  ?Date Value Ref Range Status  ?09/30/2021 4.2 3.5 - 5.2 mmol/L Final  ?   ?  ?  Passed - Patient is not pregnant  ?  ?  Passed - Last BP in normal range  ?  BP Readings from Last 1 Encounters:  ?09/30/21 116/82  ?   ?  ?  Passed - Valid encounter within last 6 months  ?  Recent Outpatient Visits   ?      ? 4 weeks ago Essential (primary) hypertension  ? [facility] Glean Hess, MD  ? 7 months ago Essential (primary) hypertension  ? [facility] Glean Hess, MD  ? 11 months ago Hyponatremia  ? [facility] Glean Hess, MD  ? 1 year ago Weight loss, unintentional  ? [facility] Glean Hess, MD  ? 1 year ago Essential (primary) hypertension  ? [facility] Glean [facility], MD  ?  ?  ?Future Appointments   ?        ? In 5 months [name], MD [facility], [place]  ?  ? ?  ?  ?  ? ? ?

## 2021-11-04 ENCOUNTER — Telehealth: Payer: Self-pay

## 2021-11-04 NOTE — Telephone Encounter (Signed)
Called pt left VM as a reminder to call and schedule mammogram. [phone] ° °KP °

## 2021-11-10 ENCOUNTER — Other Ambulatory Visit: Payer: Self-pay | Admitting: Internal Medicine

## 2021-11-10 DIAGNOSIS — R413 Other amnesia: Secondary | ICD-10-CM

## 2021-11-11 NOTE — Telephone Encounter (Signed)
Requested Prescriptions  ?Pending Prescriptions Disp Refills  ?? donepezil (ARICEPT) 10 MG tablet [Pharmacy Med Name: DONEPEZIL HCL 10 MG TABLET] 90 tablet 1  ?  Sig: TAKE 1 TABLET BY MOUTH EVERYDAY AT BEDTIME  ?  ? Neurology:  Alzheimer's Agents Passed - 11/10/2021  4:02 PM  ?  ?  Passed - Valid encounter within last 6 months  ?  Recent Outpatient Visits   ?      ? 1 month ago Essential (primary) hypertension  ? [facility] Glean Hess, MD  ? 7 months ago Essential (primary) hypertension  ? [facility] Glean Hess, MD  ? 12 months ago Hyponatremia  ? [facility] Glean Hess, MD  ? 1 year ago Weight loss, unintentional  ? [facility] Glean Hess, MD  ? 1 year ago Essential (primary) hypertension  ? [facility] Glean Hess, MD  ?  ?  ?Future Appointments   ?        ? In 4 months [name], MD [facility], [place]  ?  ? ?  ?  ?  ? ?

## 2021-11-13 ENCOUNTER — Other Ambulatory Visit: Payer: Self-pay | Admitting: Internal Medicine

## 2021-11-13 DIAGNOSIS — E785 Hyperlipidemia, unspecified: Secondary | ICD-10-CM

## 2021-11-13 DIAGNOSIS — R413 Other amnesia: Secondary | ICD-10-CM

## 2021-11-14 NOTE — Telephone Encounter (Signed)
Requested Prescriptions  ?Pending Prescriptions Disp Refills  ?? atorvastatin (LIPITOR) 20 MG tablet [Pharmacy Med Name: ATORVASTATIN 20 MG TABLET] 90 tablet 3  ?  Sig: TAKE 1 TABLET BY MOUTH DAILY AT 6 PM.  ?  ? Cardiovascular:  Antilipid - Statins Failed - 11/13/2021  4:27 PM  ?  ?  Failed - Lipid Panel in normal range within the last 12 months  ?  Cholesterol, Total  ?Date Value Ref Range Status  ?09/30/2021 206 (H) 100 - 199 mg/dL Final  ? ?LDL Chol Calc (NIH)  ?Date Value Ref Range Status  ?09/30/2021 129 (H) 0 - 99 mg/dL Final  ? ?HDL  ?Date Value Ref Range Status  ?09/30/2021 23 (L) >39 mg/dL Final  ? ?Triglycerides  ?Date Value Ref Range Status  ?09/30/2021 299 (H) 0 - 149 mg/dL Final  ? ?  ?  ?  Passed - Patient is not pregnant  ?  ?  Passed - Valid encounter within last 12 months  ?  Recent Outpatient Visits   ?      ? 1 month ago Essential (primary) hypertension  ? [facility] Casey Lara, Casey Lara  ? 8 months ago Essential (primary) hypertension  ? [facility] Casey Lara, Casey Lara  ? 1 year ago Hyponatremia  ? [facility] Casey Lara, Casey Lara  ? 1 year ago Weight loss, unintentional  ? [facility] Casey Lara, Casey Lara  ? 1 year ago Essential (primary) hypertension  ? [facility] Casey Lara, Casey Lara  ?  ?  ?Future Appointments   ?        ? In 4 months Casey Lara, Casey Lara [facility] [place]  ?  ? ?  ?  ?  ?? donepezil (ARICEPT) 10 MG tablet [Pharmacy Med Name: DONEPEZIL HCL 10 MG TABLET] 90 tablet 0  ?  Sig: TAKE 1 TABLET BY MOUTH EVERYDAY AT BEDTIME  ?  ? Neurology:  Alzheimer's Agents Passed - 11/13/2021  4:27 PM  ?  ?  Passed - Valid encounter within last 6 months  ?  Recent Outpatient Visits   ?      ? 1 month ago Essential (primary) hypertension  ? [facility] Casey Lara, Casey Lara  ? 8 months ago Essential (primary) hypertension  ? [facility] Casey Lara, Casey Lara  ? 1 year ago Hyponatremia  ? [facility]  Casey Lara, Casey Lara  ? 1 year ago Weight loss, unintentional  ? [facility] Casey Lara, Casey Lara  ? 1 year ago Essential (primary) hypertension  ? [facility] Casey Lara, Casey Lara  ?  ?  ?Future Appointments   ?        ? In 4 months Casey Lara, Casey Lara [facility]  ?  ? ?  ?  ?  ?? bisoprolol (ZEBETA) 10 MG tablet [Pharmacy Med Name: BISOPROLOL FUMARATE 10 MG TAB] 90 tablet 0  ?  Sig: TAKE 1 TABLET BY MOUTH EVERY DAY  ?  ? Cardiovascular: Beta Blockers 2 Failed - 11/13/2021  4:27 PM  ?  ?  Failed - Cr in normal range and within 360 days  ?  Creatinine, Ser  ?Date Value Ref Range Status  ?09/30/2021 1.31 (H) 0.57 - 1.00 mg/dL Final  ?   ?  ?  Passed - Last BP in normal range  ?  BP Readings from Last 1 Encounters:  ?  09/30/21 116/82  ?   ?  ?  Passed - Last Heart Rate in normal range  ?  Pulse Readings from Last 1 Encounters:  ?09/30/21 60  ?   ?  ?  Passed - Valid encounter within last 6 months  ?  Recent Outpatient Visits   ?      ? 1 month ago Essential (primary) hypertension  ? [facility] Casey Lara, Casey Lara  ? 8 months ago Essential (primary) hypertension  ? [facility] Casey Lara, Casey Lara  ? 1 year ago Hyponatremia  ? [facility] Casey Lara, Casey Lara  ? 1 year ago Weight loss, unintentional  ? [facility] Casey Lara, Casey Lara  ? 1 year ago Essential (primary) hypertension  ? [facility] Casey Lara, Casey Lara  ?  ?  ?Future Appointments   ?        ? In 4 months Casey Lara, Casey Lara [facility], [place]  ?  ? ?  ?  ?  ? ?

## 2021-11-25 ENCOUNTER — Telehealth: Payer: Self-pay | Admitting: Internal Medicine

## 2021-11-25 NOTE — Telephone Encounter (Signed)
Copied from [place] [id number]. Topic: Medicare AWV ?>> Nov 25, 2021 10:50 AM Cher Nakai R wrote: ?Reason for CRM:  ?Left message for patient to call back and schedule Medicare Annual Wellness Visit (AWV) in office.  ? ?If unable to come into the office for AWV,  please offer to do virtually or by telephone. ? ?Last AWV: 09/28/2019 ? ?Please schedule at anytime with [facility] Health Advisor.     ? ?30 minute appointment for Virtual or phone ?45 minute appointment for in office or Initial virtual/phone ? ?Any questions, please call me at [phone] ?

## 2021-12-10 ENCOUNTER — Ambulatory Visit
Admission: RE | Admit: 2021-12-10 | Discharge: 2021-12-10 | Disposition: A | Discharge status: Home / Self Care | Payer: Medicare Other | Source: Ambulatory Visit | Attending: Internal Medicine | Admitting: Internal Medicine

## 2021-12-10 DIAGNOSIS — Z1231 Encounter for screening mammogram for malignant neoplasm of breast: Secondary | ICD-10-CM | POA: Diagnosis not present

## 2022-02-16 ENCOUNTER — Other Ambulatory Visit: Payer: Self-pay | Admitting: Internal Medicine

## 2022-02-16 DIAGNOSIS — E785 Hyperlipidemia, unspecified: Secondary | ICD-10-CM

## 2022-02-17 NOTE — Telephone Encounter (Signed)
Requested Prescriptions  Pending Prescriptions Disp Refills  . atorvastatin (LIPITOR) 20 MG tablet [Pharmacy Med Name: ATORVASTATIN 20 MG TABLET] 90 tablet 0    Sig: TAKE 1 TABLET BY MOUTH DAILY AT 6 PM.     Cardiovascular:  Antilipid - Statins Failed - 02/16/2022  2:04 AM      Failed - Lipid Panel in normal range within the last 12 months    Cholesterol, Total  Date Value Ref Range Status  09/30/2021 206 (H) 100 - 199 mg/dL Final   LDL Chol Calc (NIH)  Date Value Ref Range Status  09/30/2021 129 (H) 0 - 99 mg/dL Final   HDL  Date Value Ref Range Status  09/30/2021 23 (L) >39 mg/dL Final   Triglycerides  Date Value Ref Range Status  09/30/2021 299 (H) 0 - 149 mg/dL Final         Passed - Patient is not pregnant      Passed - Valid encounter within last 12 months    Recent Outpatient Visits          4 months ago Essential (primary) hypertension   [facility] Glean Hess, MD   11 months ago Essential (primary) hypertension   [facility] Glean Hess, MD   1 year ago Hyponatremia   [facility] Glean Hess, MD   1 year ago Weight loss, unintentional   [facility] Glean Hess, MD   1 year ago Essential (primary) hypertension   [facility] Glean Hess, MD      Future Appointments            In 1 month Army Melia, Jesse Sans, MD [facility], [facility]

## 2022-02-25 ENCOUNTER — Other Ambulatory Visit: Payer: Self-pay | Admitting: Internal Medicine

## 2022-02-25 DIAGNOSIS — R413 Other amnesia: Secondary | ICD-10-CM

## 2022-02-25 DIAGNOSIS — G4700 Insomnia, unspecified: Secondary | ICD-10-CM

## 2022-02-26 NOTE — Telephone Encounter (Signed)
Requested Prescriptions  Pending Prescriptions Disp Refills  . diazepam (VALIUM) 5 MG tablet [Pharmacy Med Name: DIAZEPAM 5 MG TABLET] 30 tablet     Sig: TAKE 1 TABLET BY MOUTH DAILY AS NEEDED.     Not Delegated - Psychiatry: Anxiolytics/Hypnotics 2 Failed - 02/25/2022  5:03 PM      Failed - This refill cannot be delegated      Failed - Urine Drug Screen completed in last 360 days      Passed - Patient is not pregnant      Passed - Valid encounter within last 6 months    Recent Outpatient Visits          4 months ago Essential (primary) hypertension   [facility] Glean Hess, MD   11 months ago Essential (primary) hypertension   [facility] Glean Hess, MD   1 year ago Hyponatremia   [facility] Glean Hess, MD   1 year ago Weight loss, unintentional   [facility] Glean Hess, MD   1 year ago Essential (primary) hypertension   [facility] Glean Hess, MD      Future Appointments            In 1 month [name], MD [facility] [place]           . donepezil (ARICEPT) 10 MG tablet [Pharmacy Med Name: DONEPEZIL HCL 10 MG TABLET] 90 tablet 0    Sig: TAKE 1 TABLET BY MOUTH EVERYDAY AT BEDTIME     Neurology:  Alzheimer's Agents Passed - 02/25/2022  5:03 PM      Passed - Valid encounter within last 6 months    Recent Outpatient Visits          4 months ago Essential (primary) hypertension   [facility] Glean Hess, MD   11 months ago Essential (primary) hypertension   [facility] Glean Hess, MD   1 year ago Hyponatremia   [facility] Glean Hess, MD   1 year ago Weight loss, unintentional   [facility] Glean Hess, MD   1 year ago Essential (primary) hypertension   [facility] Glean Hess, MD      Future Appointments            In 1 month Army Melia, Jesse Sans, MD [facility], [facility]

## 2022-02-26 NOTE — Telephone Encounter (Signed)
Requested medication (s) are due for refill today: yes  Requested medication (s) are on the active medication list: yes  Last refill:  09/30/21  Future visit scheduled: yes  Notes to clinic:  med not delegated to NT to RF   Requested Prescriptions  Pending Prescriptions Disp Refills   diazepam (VALIUM) 5 MG tablet [Pharmacy Med Name: DIAZEPAM 5 MG TABLET] 30 tablet     Sig: TAKE 1 TABLET BY MOUTH DAILY AS NEEDED.     Not Delegated - Psychiatry: Anxiolytics/Hypnotics 2 Failed - 02/25/2022  5:03 PM      Failed - This refill cannot be delegated      Failed - Urine Drug Screen completed in last 360 days      Passed - Patient is not pregnant      Passed - Valid encounter within last 6 months    Recent Outpatient Visits           4 months ago Essential (primary) hypertension   [facility] Glean Hess, MD   11 months ago Essential (primary) hypertension   [facility] Glean Hess, MD   1 year ago Hyponatremia   [facility] Glean [facility], MD   1 year ago Weight loss, unintentional   [facility] Glean Hess, MD   1 year ago Essential (primary) hypertension   [place], Laura H, MD       Future Appointments             In 1 month [name], MD [facility]            Signed Prescriptions Disp Refills   donepezil (ARICEPT) 10 MG tablet 90 tablet 0    Sig: TAKE 1 TABLET BY MOUTH EVERYDAY AT BEDTIME     Neurology:  Alzheimer's Agents Passed - 02/25/2022  5:03 PM      Passed - Valid encounter within last 6 months    Recent Outpatient Visits           4 months ago Essential (primary) hypertension   [facility] Glean Hess, MD   11 months ago Essential (primary) hypertension   [facility] Glean Hess, MD   1 year ago Hyponatremia   [facility] Glean Hess, MD   1 year ago Weight loss, unintentional   [facility] Glean Hess, MD   1 year ago Essential (primary) hypertension   [facility] Glean Hess, MD       Future Appointments             In 1 month Army Melia, Jesse Sans, MD [facility], [facility]

## 2022-02-26 NOTE — Telephone Encounter (Signed)
Please review. Last office visit 09/30/21.  KP

## 2022-03-31 ENCOUNTER — Ambulatory Visit: Payer: Medicare Other | Admitting: Internal Medicine

## 2022-04-07 ENCOUNTER — Other Ambulatory Visit: Payer: Self-pay | Admitting: Internal Medicine

## 2022-04-07 DIAGNOSIS — G4700 Insomnia, unspecified: Secondary | ICD-10-CM

## 2022-04-09 NOTE — Telephone Encounter (Signed)
Please review. Please review last office visit 09/30/2021.  KP

## 2022-04-09 NOTE — Telephone Encounter (Signed)
Requested medication (s) are due for refill today -yes  Requested medication (s) are on the active medication list -yes  Future visit scheduled -no  Last refill: 02/27/22 #30  Notes to clinic: non delegated Rx  Requested Prescriptions  Pending Prescriptions Disp Refills   diazepam (VALIUM) 5 MG tablet [Pharmacy Med Name: DIAZEPAM 5 MG TABLET] 30 tablet 0    Sig: TAKE 1 TABLET BY MOUTH EVERY DAY AS NEEDED     Not Delegated - Psychiatry: Anxiolytics/Hypnotics 2 Failed - 04/07/2022  6:52 PM      Failed - This refill cannot be delegated      Failed - Urine Drug Screen completed in last 360 days      Failed - Valid encounter within last 6 months    Recent Outpatient Visits           6 months ago Essential (primary) hypertension   [place] [facility], Jesse Sans, MD   1 year ago Essential (primary) hypertension   [place] [facility], Jesse Sans, MD   1 year ago Hyponatremia   [place] [facility], Jesse Sans, MD   1 year ago Weight loss, unintentional   [place] [facility] at [facility], Jesse Sans, MD   1 year ago Essential (primary) hypertension   [place] [facility], Jesse Sans, MD              Passed - Patient is not pregnant         Requested Prescriptions  Pending Prescriptions Disp Refills   diazepam (VALIUM) 5 MG tablet [Pharmacy Med Name: DIAZEPAM 5 MG TABLET] 30 tablet 0    Sig: TAKE 1 TABLET BY MOUTH EVERY DAY AS NEEDED     Not Delegated - Psychiatry: Anxiolytics/Hypnotics 2 Failed - 04/07/2022  6:52 PM      Failed - This refill cannot be delegated      Failed - Urine Drug Screen completed in last 360 days      Failed - Valid encounter within last 6 months    Recent Outpatient Visits           6 months ago Essential  (primary) hypertension   [place] [facility], Jesse Sans, MD   1 year ago Essential (primary) hypertension   [place] [facility] at [facility], Jesse Sans, MD   1 year ago Hyponatremia   [place] [facility] at [facility], Jesse Sans, MD   1 year ago Weight loss, unintentional   [place] [facility] at [facility], Jesse Sans, MD   1 year ago Essential (primary) hypertension    [facility] Medicine at [facility], Jesse Sans, MD              Passed - Patient is not pregnant

## 2022-04-10 NOTE — Telephone Encounter (Signed)
Noted  KP 

## 2022-04-10 NOTE — Telephone Encounter (Signed)
Please schedule pt appt.  KP

## 2022-04-18 ENCOUNTER — Other Ambulatory Visit: Payer: Self-pay | Admitting: Internal Medicine

## 2022-04-18 DIAGNOSIS — I10 Essential (primary) hypertension: Secondary | ICD-10-CM

## 2022-04-20 NOTE — Telephone Encounter (Signed)
Called, left VM to call back to schedule OV. Will refill medication. Patient needs OV for additional refills.  Requested Prescriptions  Pending Prescriptions Disp Refills  . amLODipine (NORVASC) 5 MG tablet [Pharmacy Med Name: AMLODIPINE BESYLATE 5 MG TAB] 90 tablet 0    Sig: TAKE 1 TABLET (5 MG TOTAL) BY MOUTH DAILY.     Cardiovascular: Calcium Channel Blockers 2 Failed - 04/18/2022  1:18 AM      Failed - Valid encounter within last 6 months    Recent Outpatient Visits          6 months ago Essential (primary) hypertension   [place] [facility] at [facility], Jesse Sans, MD   1 year ago Essential (primary) hypertension   [place] [facility] at [facility], Jesse Sans, MD   1 year ago Hyponatremia   [place] [facility] at [facility], Jesse Sans, MD   1 year ago Weight loss, unintentional   [place] and Sports Medicine at [facility], Jesse Sans, MD   1 year ago Essential (primary) hypertension   [place] [facility] at [facility], Jesse Sans, MD             Passed - Last BP in normal range    BP Readings from Last 1 Encounters:  09/30/21 116/82         Passed - Last Heart Rate in normal range    Pulse Readings from Last 1 Encounters:  09/30/21 60         . memantine (NAMENDA) 5 MG tablet [Pharmacy Med Name: MEMANTINE HCL 5 MG TABLET] 180 tablet 0    Sig: TAKE 1 TABLET BY MOUTH TWICE A DAY     Neurology:  Alzheimer's Agents 2 Failed - 04/18/2022  1:18 AM      Failed - Cr in normal range and within 360 days    Creatinine, Ser  Date Value Ref Range Status  09/30/2021 1.31 (H) 0.57 - 1.00 mg/dL Final         Failed - Valid encounter within last 6 months    Recent Outpatient Visits          6 months ago Essential (primary) hypertension   [place] [facility] at [facility], Jesse Sans, MD   1 year ago Essential (primary) hypertension   [place] [facility] at [facility], Jesse Sans, MD   1 year ago Hyponatremia   [place] [facility] at [facility], Jesse Sans, MD   1 year ago Weight loss, unintentional   [place] and Sports Medicine at [facility], Jesse Sans, MD   1 year ago Essential (primary) hypertension   [place] [facility] at [facility], Jesse Sans, MD             Passed - eGFR is 5 or above and within 360 days    GFR calc Af Amer  Date Value Ref Range Status  09/28/2019 92 >59 mL/min/1.73 Final   GFR, Estimated  Date Value Ref Range Status  11/05/2020 >60 >60 mL/min Final    Comment:    (NOTE) Calculated using the CKD-EPI Creatinine Equation (2021)    eGFR  Date Value Ref Range Status  09/30/2021 44 (L) >59 mL/min/1.73 Final

## 2022-05-04 ENCOUNTER — Other Ambulatory Visit: Payer: Self-pay | Admitting: Internal Medicine

## 2022-05-04 DIAGNOSIS — G4700 Insomnia, unspecified: Secondary | ICD-10-CM

## 2022-05-05 NOTE — Telephone Encounter (Signed)
Requested medication (s) are due for refill todayyes  Requested medication (s) are on the active medication list:yes  Last refill:  04/09/22  Future visit scheduled: no  Notes to clinic:  Unable to refill per protocol, cannot delegate.      Requested Prescriptions  Pending Prescriptions Disp Refills   diazepam (VALIUM) 5 MG tablet [Pharmacy Med Name: DIAZEPAM 5 MG TABLET] 30 tablet 0    Sig: TAKE 1 TABLET BY MOUTH EVERY DAY AS NEEDED     Not Delegated - Psychiatry: Anxiolytics/Hypnotics 2 Failed - 05/04/2022  5:15 PM      Failed - This refill cannot be delegated      Failed - Urine Drug Screen completed in last 360 days      Failed - Valid encounter within last 6 months    Recent Outpatient Visits           7 months ago Essential (primary) hypertension   [place] [facility] at [facility], Jesse Sans, MD   1 year ago Essential (primary) hypertension   [place] [facility] at [facility], Jesse Sans, MD   1 year ago Hyponatremia   [place] [facility] at [facility], Jesse Sans, MD   1 year ago Weight loss, unintentional   [place] and Sports Medicine at [facility], Jesse Sans, MD   1 year ago Essential (primary) hypertension   [place] [facility] at [facility], Jesse Sans, MD              Passed - Patient is not pregnant

## 2022-05-11 ENCOUNTER — Other Ambulatory Visit: Payer: Self-pay | Admitting: Internal Medicine

## 2022-05-14 ENCOUNTER — Other Ambulatory Visit: Payer: Self-pay | Admitting: Internal Medicine

## 2022-05-14 DIAGNOSIS — G4700 Insomnia, unspecified: Secondary | ICD-10-CM

## 2022-05-15 NOTE — Telephone Encounter (Signed)
Please review. Last office visit 09/30/2021.  KP

## 2022-05-15 NOTE — Telephone Encounter (Signed)
Requested medications are due for refill today.  yes  Requested medications are on the active medications list.  yes  Last refill. 04/09/2022 #30 0 rf  Future visit scheduled.   yes  Notes to clinic.  Refill not delegated.    Requested Prescriptions  Pending Prescriptions Disp Refills   diazepam (VALIUM) 5 MG tablet [Pharmacy Med Name: DIAZEPAM 5 MG TABLET] 30 tablet 0    Sig: TAKE 1 TABLET BY MOUTH EVERY DAY AS NEEDED     Not Delegated - Psychiatry: Anxiolytics/Hypnotics 2 Failed - 05/14/2022  5:38 PM      Failed - This refill cannot be delegated      Failed - Urine Drug Screen completed in last 360 days      Failed - Valid encounter within last 6 months    Recent Outpatient Visits           7 months ago Essential (primary) hypertension   [place] [facility] at [facility], Jesse Sans, MD   1 year ago Essential (primary) hypertension   [place] [facility] at [facility], Jesse Sans, MD   1 year ago Hyponatremia   [place] [facility] at [facility], Jesse Sans, MD   1 year ago Weight loss, unintentional   [place] and Sports Medicine at [facility], Jesse Sans, MD   2 years ago Essential (primary) hypertension   [place] [facility] at [facility], Jesse Sans, MD       Future Appointments             In 1 week [name], MD [facility] at [facility] [place] - Patient is not pregnant

## 2022-05-24 ENCOUNTER — Other Ambulatory Visit: Payer: Self-pay | Admitting: Internal Medicine

## 2022-05-24 MED ORDER — MEMANTINE HCL 5 MG PO TABS
5.0000 mg | ORAL_TABLET | Freq: Two times a day (BID) | ORAL | 0 refills | Status: DC
Start: 2022-05-24 — End: 2022-11-22

## 2022-05-25 ENCOUNTER — Other Ambulatory Visit: Payer: Self-pay | Admitting: Internal Medicine

## 2022-05-25 ENCOUNTER — Ambulatory Visit: Payer: Medicare Other | Admitting: Internal Medicine

## 2022-05-25 DIAGNOSIS — R413 Other amnesia: Secondary | ICD-10-CM

## 2022-05-25 DIAGNOSIS — E785 Hyperlipidemia, unspecified: Secondary | ICD-10-CM

## 2022-05-26 NOTE — Telephone Encounter (Signed)
Requested medication (s) are due for refill today: yes  Requested medication (s) are on the active medication list: yes  Last refill:  Lipitor 02/17/22 #90 with 0 RF, Aricept 02/26/22 #90 with 0 RF  Future visit scheduled: no, seen 09/30/21, NO SHOW in April and August  Notes to clinic:  Failed protocol due to no valid visit within 6  months, two NO SHOWS and no upcoming visit, please assess.       Requested Prescriptions  Pending Prescriptions Disp Refills   atorvastatin (LIPITOR) 20 MG tablet [Pharmacy Med Name: ATORVASTATIN 20 MG TABLET] 90 tablet 0    Sig: TAKE 1 TABLET BY MOUTH DAILY AT 6 PM.     Cardiovascular:  Antilipid - Statins Failed - 05/25/2022  1:29 AM      Failed - Lipid Panel in normal range within the last 12 months    Cholesterol, Total  Date Value Ref Range Status  09/30/2021 206 (H) 100 - 199 mg/dL Final   LDL Chol Calc (NIH)  Date Value Ref Range Status  09/30/2021 129 (H) 0 - 99 mg/dL Final   HDL  Date Value Ref Range Status  09/30/2021 23 (L) >39 mg/dL Final   Triglycerides  Date Value Ref Range Status  09/30/2021 299 (H) 0 - 149 mg/dL Final         Passed - Patient is not pregnant      Passed - Valid encounter within last 12 months    Recent Outpatient Visits           7 months ago Essential (primary) hypertension   [place] [facility] at [facility], Jesse Sans, MD   1 year ago Essential (primary) hypertension   [place] [facility] at [facility], Jesse Sans, MD   1 year ago Hyponatremia   [place] [facility] at [facility], Jesse Sans, MD   1 year ago Weight loss, unintentional   [place] [employer] and Sports Medicine at [facility], Jesse Sans, MD   2 years ago Essential (primary) hypertension   [place] [facility] at [facility], Jesse Sans, MD               donepezil  (ARICEPT) 10 MG tablet [Pharmacy Med Name: DONEPEZIL HCL 10 MG TABLET] 90 tablet 0    Sig: TAKE 1 TABLET BY MOUTH EVERYDAY AT BEDTIME     Neurology:  Alzheimer's Agents Failed - 05/25/2022  1:29 AM      Failed - Valid encounter within last 6 months    Recent Outpatient Visits           7 months ago Essential (primary) hypertension   [place] [facility] at [facility], Jesse Sans, MD   1 year ago Essential (primary) hypertension   [place] [facility] at [facility], Jesse Sans, MD   1 year ago Hyponatremia   [place] [facility] at [facility], Jesse Sans, MD   1 year ago Weight loss, unintentional   [place] and Sports Medicine at [facility], Jesse Sans, MD   2 years ago Essential (primary) hypertension    Primary [facility] and Sports Medicine at [facility], Jesse Sans, MD

## 2022-05-26 NOTE — Telephone Encounter (Signed)
Requested Prescriptions  Pending Prescriptions Disp Refills  . bisoprolol (ZEBETA) 10 MG tablet [Pharmacy Med Name: BISOPROLOL FUMARATE 10 MG TAB] 30 tablet 0    Sig: TAKE 1 TABLET BY MOUTH EVERY DAY     Cardiovascular: Beta Blockers 2 Failed - 05/24/2022  1:17 AM      Failed - Cr in normal range and within 360 days    Creatinine, Ser  Date Value Ref Range Status  09/30/2021 1.31 (H) 0.57 - 1.00 mg/dL Final         Failed - Valid encounter within last 6 months    Recent Outpatient Visits          7 months ago Essential (primary) hypertension   [place] [facility] at [facility], Jesse Sans, MD   1 year ago Essential (primary) hypertension   [place] [facility] at [facility], Jesse Sans, MD   1 year ago Hyponatremia   [place] [facility] at [facility], Jesse Sans, MD   1 year ago Weight loss, unintentional   [place] and Sports Medicine at [facility], Jesse Sans, MD   2 years ago Essential (primary) hypertension   [place] [facility] at [facility], Jesse Sans, MD             Passed - Last BP in normal range    BP Readings from Last 1 Encounters:  09/30/21 116/82         Passed - Last Heart Rate in normal range    Pulse Readings from Last 1 Encounters:  09/30/21 60

## 2022-05-28 ENCOUNTER — Encounter: Payer: Self-pay | Admitting: Internal Medicine

## 2022-06-13 ENCOUNTER — Other Ambulatory Visit: Payer: Self-pay | Admitting: Internal Medicine

## 2022-06-15 NOTE — Telephone Encounter (Signed)
Patient needs OV, will refill medication until OV can be made. Patient needs OV for additional refills.  Requested Prescriptions  Pending Prescriptions Disp Refills   losartan (COZAAR) 25 MG tablet [Pharmacy Med Name: LOSARTAN POTASSIUM 25 MG TAB] 180 tablet 0    Sig: TAKE 2 TABLETS BY MOUTH DAILY (REPLACES '50MG'$  TABLET)     Cardiovascular:  Angiotensin Receptor Blockers Failed - 06/13/2022  1:16 AM      Failed - Cr in normal range and within 180 days    Creatinine, Ser  Date Value Ref Range Status  09/30/2021 1.31 (H) 0.57 - 1.00 mg/dL Final         Failed - K in normal range and within 180 days    Potassium  Date Value Ref Range Status  09/30/2021 4.2 3.5 - 5.2 mmol/L Final         Failed - Valid encounter within last 6 months    Recent Outpatient Visits           8 months ago Essential (primary) hypertension   [place] [facility] at [facility], Jesse Sans, MD   1 year ago Essential (primary) hypertension   [place] [facility] at [facility], Jesse Sans, MD   1 year ago Hyponatremia   [place] [facility] at [facility], Jesse Sans, MD   1 year ago Weight loss, unintentional   [place] and Sports Medicine at [facility], Jesse Sans, MD   2 years ago Essential (primary) hypertension   [place] [facility] at [facility], Jesse Sans, MD              Passed - Patient is not pregnant      Passed - Last BP in normal range    BP Readings from Last 1 Encounters:  09/30/21 116/82

## 2022-06-17 ENCOUNTER — Other Ambulatory Visit: Payer: Self-pay | Admitting: Internal Medicine

## 2022-06-17 NOTE — Telephone Encounter (Signed)
Left voice mail to set up medication refill

## 2022-06-17 NOTE — Telephone Encounter (Signed)
Requested medication (s) are due for refill today: Due 06/26/22  Requested medication (s) are on the active medication list: yes    Last refill: 05/26/22  #30 0 refills  Future visit scheduled no  Notes to clinic:Appt needed, attempted to reach pt, left VM to call back to secure appt. Last refill may have been courtesy refill, several 'No shows'. Please review.  Requested Prescriptions  Pending Prescriptions Disp Refills   bisoprolol (ZEBETA) 10 MG tablet [Pharmacy Med Name: BISOPROLOL FUMARATE 10 MG TAB] 30 tablet 0    Sig: TAKE 1 TABLET BY MOUTH EVERY DAY     Cardiovascular: Beta Blockers 2 Failed - 06/17/2022  2:07 AM      Failed - Cr in normal range and within 360 days    Creatinine, Ser  Date Value Ref Range Status  09/30/2021 1.31 (H) 0.57 - 1.00 mg/dL Final         Failed - Valid encounter within last 6 months    Recent Outpatient Visits           8 months ago Essential (primary) hypertension   [place] [facility] at [facility], Jesse Sans, MD   1 year ago Essential (primary) hypertension   [place] [facility] at [facility], Jesse Sans, MD   1 year ago Hyponatremia   [place] [facility] at [facility], Jesse Sans, MD   1 year ago Weight loss, unintentional   [place] and Sports Medicine at [facility], Jesse Sans, MD   2 years ago Essential (primary) hypertension   [place] [facility] at [facility], Jesse Sans, MD              Passed - Last BP in normal range    BP Readings from Last 1 Encounters:  09/30/21 116/82         Passed - Last Heart Rate in normal range    Pulse Readings from Last 1 Encounters:  09/30/21 60

## 2022-06-19 ENCOUNTER — Other Ambulatory Visit: Payer: Self-pay | Admitting: Internal Medicine

## 2022-06-19 DIAGNOSIS — E785 Hyperlipidemia, unspecified: Secondary | ICD-10-CM

## 2022-06-19 NOTE — Telephone Encounter (Signed)
Requested Prescriptions  Pending Prescriptions Disp Refills   atorvastatin (LIPITOR) 20 MG tablet [Pharmacy Med Name: ATORVASTATIN 20 MG TABLET] 90 tablet 0    Sig: TAKE 1 TABLET BY MOUTH DAILY AT 6 PM.     Cardiovascular:  Antilipid - Statins Failed - 06/19/2022  2:52 AM      Failed - Lipid Panel in normal range within the last 12 months    Cholesterol, Total  Date Value Ref Range Status  09/30/2021 206 (H) 100 - 199 mg/dL Final   LDL Chol Calc (NIH)  Date Value Ref Range Status  09/30/2021 129 (H) 0 - 99 mg/dL Final   HDL  Date Value Ref Range Status  09/30/2021 23 (L) >39 mg/dL Final   Triglycerides  Date Value Ref Range Status  09/30/2021 299 (H) 0 - 149 mg/dL Final         Passed - Patient is not pregnant      Passed - Valid encounter within last 12 months    Recent Outpatient Visits           8 months ago Essential (primary) hypertension   [place] [facility] at [facility], Jesse Sans, MD   1 year ago Essential (primary) hypertension   [place] [facility] at [facility], Jesse Sans, MD   1 year ago Hyponatremia   [place] [facility] at [facility], Jesse Sans, MD   1 year ago Weight loss, unintentional   [place] and Sports Medicine at [facility], Jesse Sans, MD   2 years ago Essential (primary) hypertension    Primary Care and Sports Medicine at [facility], Jesse Sans, MD

## 2022-06-20 ENCOUNTER — Other Ambulatory Visit: Payer: Self-pay | Admitting: Internal Medicine

## 2022-06-20 DIAGNOSIS — G4700 Insomnia, unspecified: Secondary | ICD-10-CM

## 2022-06-21 ENCOUNTER — Other Ambulatory Visit: Payer: Self-pay | Admitting: Internal Medicine

## 2022-06-22 MED ORDER — BISOPROLOL FUMARATE 10 MG PO TABS: 10.0000 mg | ORAL_TABLET | Freq: Every day | ORAL | 0 refills | Status: DC

## 2022-06-22 NOTE — Telephone Encounter (Signed)
Please call pt to schedule an appt.  KP

## 2022-06-22 NOTE — Telephone Encounter (Signed)
Please review. Last office visit 09/30/21.  KP

## 2022-06-22 NOTE — Telephone Encounter (Signed)
Requested medication (s) are due for refill today: yes  Requested medication (s) are on the active medication list: yes  Last refill:  05/15/22  Future visit scheduled: no  Notes to clinic:  Unable to refill per protocol, cannot delegate.      Requested Prescriptions  Pending Prescriptions Disp Refills   diazepam (VALIUM) 5 MG tablet [Pharmacy Med Name: DIAZEPAM 5 MG TABLET] 30 tablet 0    Sig: TAKE 1 TABLET BY MOUTH EVERY DAY AS NEEDED     Not Delegated - Psychiatry: Anxiolytics/Hypnotics 2 Failed - 06/20/2022  5:28 PM      Failed - This refill cannot be delegated      Failed - Urine Drug Screen completed in last 360 days      Failed - Valid encounter within last 6 months    Recent Outpatient Visits           8 months ago Essential (primary) hypertension   [place] [facility] at [facility], Jesse Sans, MD   1 year ago Essential (primary) hypertension   [place] [facility] at [facility], Jesse Sans, MD   1 year ago Hyponatremia   [place] [facility] Medicine at [facility], Jesse Sans, MD   1 year ago Weight loss, unintentional   [place] and Sports Medicine at [facility], Jesse Sans, MD   2 years ago Essential (primary) hypertension   [place] [facility] Medicine at [facility], Jesse Sans, MD              Passed - Patient is not pregnant

## 2022-06-23 ENCOUNTER — Other Ambulatory Visit: Payer: Self-pay | Admitting: Internal Medicine

## 2022-06-23 DIAGNOSIS — R413 Other amnesia: Secondary | ICD-10-CM

## 2022-06-23 NOTE — Telephone Encounter (Signed)
Requested medications are due for refill today.  yes  Requested medications are on the active medications list.  yes  Last refill. 02/26/2022 #90 0 rf  Future visit scheduled.   no  Notes to clinic.  To pharmacy: DX Code Needed  . PT needs appt.    Requested Prescriptions  Pending Prescriptions Disp Refills   donepezil (ARICEPT) 10 MG tablet [Pharmacy Med Name: DONEPEZIL HCL 10 MG TABLET] 90 tablet 0    Sig: TAKE 1 TABLET BY MOUTH EVERYDAY AT BEDTIME     Neurology:  Alzheimer's Agents Failed - 06/23/2022  5:33 PM      Failed - Valid encounter within last 6 months    Recent Outpatient Visits           8 months ago Essential (primary) hypertension   [place] [facility] at [facility], Jesse Sans, MD   1 year ago Essential (primary) hypertension   [place] [facility] Medicine at [facility], Jesse Sans, MD   1 year ago Hyponatremia   [place] [facility] Medicine at [facility], Jesse Sans, MD   1 year ago Weight loss, unintentional   [place] and Sports Medicine at [facility], Jesse Sans, MD   2 years ago Essential (primary) hypertension   [place] [facility] and [facility] Medicine at [facility], Jesse Sans, MD

## 2022-07-15 ENCOUNTER — Other Ambulatory Visit: Payer: Self-pay | Admitting: Internal Medicine

## 2022-07-15 NOTE — Telephone Encounter (Signed)
Requested medication (s) are due for refill today: yes  Requested medication (s) are on the active medication list: yes  Last refill:  06/22/22 #30/0  Future visit scheduled: no  Notes to clinic:  pt is due for appt and has already been given courtesy refill. LVMTCB, please advise      Requested Prescriptions  Pending Prescriptions Disp Refills   bisoprolol (ZEBETA) 10 MG tablet [Pharmacy Med Name: BISOPROLOL FUMARATE 10 MG TAB] 30 tablet 0    Sig: TAKE 1 TABLET BY MOUTH EVERY DAY     Cardiovascular: Beta Blockers 2 Failed - 07/15/2022 12:41 AM      Failed - Cr in normal range and within 360 days    Creatinine, Ser  Date Value Ref Range Status  09/30/2021 1.31 (H) 0.57 - 1.00 mg/dL Final         Failed - Valid encounter within last 6 months    Recent Outpatient Visits           9 months ago Essential (primary) hypertension   [place] [facility] at [facility], Jesse Sans, MD   1 year ago Essential (primary) hypertension   [place] [facility] Medicine at [facility], Jesse Sans, MD   1 year ago Hyponatremia   [place] [facility] and [facility] Medicine at [facility], Jesse Sans, MD   1 year ago Weight loss, unintentional   [place] and Sports Medicine at [facility], Jesse Sans, MD   2 years ago Essential (primary) hypertension   [place] [facility] and Sports Medicine at [facility], Jesse Sans, MD              Passed - Last BP in normal range    BP Readings from Last 1 Encounters:  09/30/21 116/82         Passed - Last Heart Rate in normal range    Pulse Readings from Last 1 Encounters:  09/30/21 60

## 2022-07-15 NOTE — Telephone Encounter (Signed)
Patient called, left VM to return the call to the office to scheduled an appt for medication refill request.   

## 2022-07-23 ENCOUNTER — Other Ambulatory Visit: Payer: Self-pay | Admitting: Internal Medicine

## 2022-07-23 DIAGNOSIS — R413 Other amnesia: Secondary | ICD-10-CM

## 2022-07-24 NOTE — Telephone Encounter (Signed)
Left voice mail to set up appointment refill

## 2022-07-29 ENCOUNTER — Other Ambulatory Visit: Payer: Self-pay | Admitting: Internal Medicine

## 2022-07-29 DIAGNOSIS — G4700 Insomnia, unspecified: Secondary | ICD-10-CM

## 2022-07-29 DIAGNOSIS — I10 Essential (primary) hypertension: Secondary | ICD-10-CM

## 2022-08-12 ENCOUNTER — Telehealth: Payer: Self-pay | Admitting: Internal Medicine

## 2022-08-12 NOTE — Telephone Encounter (Signed)
Copied from [place] [id number]. Topic: Medicare AWV >> Aug 12, 2022  1:50 PM Devoria Glassing wrote: Reason for CRM: Attempted to schedule AWV. Called both numbers. Unable to LVM.  Will try at later time.

## 2022-08-21 ENCOUNTER — Other Ambulatory Visit: Payer: Self-pay | Admitting: Internal Medicine

## 2022-08-21 DIAGNOSIS — I10 Essential (primary) hypertension: Secondary | ICD-10-CM

## 2022-08-28 ENCOUNTER — Other Ambulatory Visit: Payer: Self-pay | Admitting: Internal Medicine

## 2022-08-28 DIAGNOSIS — G4700 Insomnia, unspecified: Secondary | ICD-10-CM

## 2022-08-28 NOTE — Telephone Encounter (Signed)
Requested medications are due for refill today.  yes  Requested medications are on the active medications list.  yes  Last refill. 07/30/2022 #30 0 rf  Future visit scheduled.   yes  Notes to clinic.  Refill not delegated.    Requested Prescriptions  Pending Prescriptions Disp Refills   diazepam (VALIUM) 5 MG tablet [Pharmacy Med Name: DIAZEPAM 5 MG TABLET] 30 tablet 0    Sig: TAKE 1 TABLET BY MOUTH EVERY DAY AS NEEDED     Not Delegated - Psychiatry: Anxiolytics/Hypnotics 2 Failed - 08/28/2022  5:40 PM      Failed - This refill cannot be delegated      Failed - Urine Drug Screen completed in last 360 days      Failed - Valid encounter within last 6 months    Recent Outpatient Visits           11 months ago Essential (primary) hypertension   [place] [facility] at [facility], Jesse Sans, MD   1 year ago Essential (primary) hypertension   [place] [facility] at [facility], Jesse Sans, MD   1 year ago Hyponatremia   [facility] [place], Jesse Sans, MD   1 year ago Weight loss, unintentional   [place] at [facility], Jesse Sans, MD   2 years ago Essential (primary) hypertension   [place] at [facility], Jesse Sans, MD       Future Appointments             In 3 weeks [name], MD [place] at [facility] [place] - Patient is not pregnant

## 2022-08-31 NOTE — Telephone Encounter (Signed)
Please review. Last office visit 09/30/2021.  KP

## 2022-09-14 ENCOUNTER — Other Ambulatory Visit: Payer: Self-pay | Admitting: Internal Medicine

## 2022-09-15 ENCOUNTER — Other Ambulatory Visit: Payer: Self-pay | Admitting: Internal Medicine

## 2022-09-15 DIAGNOSIS — E785 Hyperlipidemia, unspecified: Secondary | ICD-10-CM

## 2022-09-23 ENCOUNTER — Encounter: Payer: Self-pay | Admitting: Internal Medicine

## 2022-09-23 ENCOUNTER — Ambulatory Visit (INDEPENDENT_AMBULATORY_CARE_PROVIDER_SITE_OTHER): Payer: Medicare Other | Admitting: Internal Medicine

## 2022-09-23 VITALS — BP 102/52 | HR 56 | Ht 63.0 in | Wt 180.0 lb

## 2022-09-23 DIAGNOSIS — G3184 Mild cognitive impairment, so stated: Secondary | ICD-10-CM | POA: Diagnosis not present

## 2022-09-23 DIAGNOSIS — E785 Hyperlipidemia, unspecified: Secondary | ICD-10-CM | POA: Diagnosis not present

## 2022-09-23 DIAGNOSIS — I700 Atherosclerosis of aorta: Secondary | ICD-10-CM

## 2022-09-23 DIAGNOSIS — R7303 Prediabetes: Secondary | ICD-10-CM | POA: Diagnosis not present

## 2022-09-23 DIAGNOSIS — Z1231 Encounter for screening mammogram for malignant neoplasm of breast: Secondary | ICD-10-CM

## 2022-09-23 DIAGNOSIS — I10 Essential (primary) hypertension: Secondary | ICD-10-CM | POA: Diagnosis not present

## 2022-09-23 NOTE — Assessment & Plan Note (Signed)
Tolerating statin medications without concerns LDL is  Lab Results  Component Value Date   LDLCALC 129 (H) 09/30/2021   with a goal of < 70. Current dose will be adjusted if needed.

## 2022-09-23 NOTE — Progress Notes (Signed)
Date:  09/23/2022   Name:  Casey Lara   DOB:  09/27/1950   MRN:  [id number]   Chief Complaint: Hypertension  Hypertension This is a chronic problem. The problem is controlled. Pertinent negatives include no blurred vision, chest pain, headaches, palpitations or shortness of breath. Past treatments include calcium channel blockers, beta blockers and angiotensin blockers. The current treatment provides significant improvement. There are no compliance problems.  Hypertensive end-organ damage includes kidney disease. There is no history of CAD/MI or CVA.  Hyperlipidemia This is a chronic problem. The problem is uncontrolled. Recent lipid tests were reviewed and are high. Pertinent negatives include no chest pain or shortness of breath. Current antihyperlipidemic treatment includes statins. The current treatment provides moderate improvement of lipids.  Diabetes She presents for her follow-up diabetic visit. Diabetes type: prediabetes. Pertinent negatives for hypoglycemia include no dizziness, headaches or nervousness/anxiousness. Pertinent negatives for diabetes include no blurred vision, no chest pain, no fatigue, no weakness and no weight loss. Pertinent negatives for diabetic complications include no CVA. Current diabetic treatments: likely eating regular diet. Her weight is decreasing steadily (has gained 20 lbs in the past year). An ACE inhibitor/angiotensin II receptor blocker is being taken.  [facility] - doing well, son administers medications.  No falls, appetite is good.  Sleep is good.  Lab Results  Component Value Date   NA 138 09/30/2021   K 4.2 09/30/2021   CO2 19 (L) 09/30/2021   GLUCOSE 126 (H) 09/30/2021   BUN 18 09/30/2021   CREATININE 1.31 (H) 09/30/2021   CALCIUM 9.3 09/30/2021   EGFR 44 (L) 09/30/2021   GFRNONAA >60 11/05/2020   Lab Results  Component Value Date   CHOL 206 (H) 09/30/2021   HDL 23 (L) 09/30/2021   LDLCALC 129 (H) 09/30/2021   TRIG 299 (H)  09/30/2021   CHOLHDL 9.0 (H) 09/30/2021   Lab Results  Component Value Date   TSH 3.080 09/30/2021   Lab Results  Component Value Date   HGBA1C 6.3 (H) 09/30/2021   Lab Results  Component Value Date   WBC 6.2 09/30/2021   HGB 15.3 09/30/2021   HCT 43.5 09/30/2021   MCV 85 09/30/2021   PLT 274 09/30/2021   Lab Results  Component Value Date   ALT 25 09/30/2021   AST 28 09/30/2021   ALKPHOS 96 09/30/2021   BILITOT 0.8 09/30/2021   No results found for: "25OHVITD2", "25OHVITD3", "VD25OH"   Review of Systems  Constitutional:  Positive for unexpected weight change. Negative for chills, fatigue and weight loss.  HENT:  Negative for trouble swallowing.   Eyes:  Negative for blurred vision.  Respiratory:  Negative for cough, chest tightness, shortness of breath and wheezing.   Cardiovascular:  Negative for chest pain, palpitations and leg swelling.  Gastrointestinal:  Negative for constipation and vomiting.  Musculoskeletal:  Negative for arthralgias.  Neurological:  Negative for dizziness, weakness, light-headedness and headaches.  Psychiatric/Behavioral:  Negative for dysphoric mood and sleep disturbance. The patient is not nervous/anxious.     Patient Active Problem List   Diagnosis Date Noted   Pulmonary nodules 11/03/2020   Aortic atherosclerosis ([place]) 10/01/2020   Mild cognitive impairment with memory loss 12/23/2018   Tobacco use disorder, severe, in early remission 06/19/2016   Adenomatous colon polyp 06/19/2015   Constipation 06/19/2015   Bilateral low back pain with left-sided sciatica 06/19/2015   H/O varicella 04/12/2015   Obstructive apnea 02/11/2015   History of malignant melanoma of skin 02/11/2015  Dyslipidemia 02/11/2015   Allergic state 02/11/2015   Essential (primary) hypertension 02/11/2015   Generalized anxiety disorder 02/11/2015   Insomnia, persistent 02/11/2015   CMC arthritis, thumb, degenerative 02/11/2015    Allergies  Allergen Reactions    Mirtazapine Other (See Comments)    Hyponatremia   Hctz [Hydrochlorothiazide] Other (See Comments)    hyponatremia    Past Surgical History:  Procedure Laterality Date   CARPAL TUNNEL RELEASE Left 2014   COLONOSCOPY  05/2013   serrated adenoma   LUMBAR [place] SURGERY     MELANOMA EXCISION  2015    Social History   Tobacco Use   Smoking status: Former    Packs/day: 1.00    Years: 50.00    Total pack years: 50.00    Types: Cigarettes    Start date: 08/03/1966    Quit date: 11/01/2020    Years since quitting: 1.8   Smokeless tobacco: Never   Tobacco comments:    smoking since age 30  1 pack daily   Vaping Use   Vaping Use: Never used  Substance Use Topics   Alcohol use: No    Alcohol/week: 0.0 standard drinks of alcohol   Drug use: No     Medication list has been reviewed and updated.  Current Meds  Medication Sig   albuterol (PROVENTIL HFA;VENTOLIN HFA) 108 (90 Base) MCG/ACT inhaler TAKE 2 PUFFS 4 TIMES A DAY (Patient taking differently: as needed. TAKE 2 PUFFS 4 TIMES A DAY)   amLODipine (NORVASC) 5 MG tablet TAKE 1 TABLET (5 MG TOTAL) BY MOUTH DAILY.   atorvastatin (LIPITOR) 20 MG tablet TAKE 1 TABLET BY MOUTH DAILY AT 6 PM.   bisoprolol (ZEBETA) 10 MG tablet TAKE 1 TABLET BY MOUTH EVERY DAY   diazepam (VALIUM) 5 MG tablet TAKE 1 TABLET BY MOUTH EVERY DAY AS NEEDED   donepezil (ARICEPT) 10 MG tablet TAKE 1 TABLET BY MOUTH EVERYDAY AT BEDTIME   fluticasone (FLONASE) 50 MCG/ACT nasal spray SPRAY 2 SPRAYS INTO EACH NOSTRIL EVERY DAY   losartan (COZAAR) 25 MG tablet TAKE 2 TABLETS BY MOUTH DAILY (REPLACES 50MG TABLET)   memantine (NAMENDA) 5 MG tablet Take 1 tablet (5 mg total) by mouth 2 (two) times daily.   Multiple Vitamin (MULTIVITAMIN WITH MINERALS) TABS tablet Take 1 tablet by mouth daily.       09/23/2022    4:04 PM 09/30/2021    4:07 PM 03/17/2021    1:38 PM 11/13/2020    3:40 PM  GAD 7 : Generalized Anxiety Score  Nervous, Anxious, on Edge 0 0 0 0   Control/stop worrying 0 0 0 0  Worry too much - different things 0 0 0 0  Trouble relaxing 0 0 0 0  Restless 0 0 0 0  Easily annoyed or irritable 0 0 0 0  Afraid - awful might happen 0 0 0 0  Total GAD 7 Score 0 0 0 0  Anxiety Difficulty Not difficult at all Not difficult at all  Not difficult at all       09/23/2022    4:04 PM 09/30/2021    4:07 PM 03/17/2021    1:38 PM  Depression screen PHQ 2/9  Decreased Interest 2 2 0  Down, Depressed, Hopeless 0 2 0  PHQ - 2 Score 2 4 0  Altered sleeping 2 0 0  Tired, decreased energy 0 2 0  Change in appetite 0 0 0  Feeling bad or failure about yourself  0 0 0  Trouble concentrating 0 2 0  Moving slowly or fidgety/restless 0 0 0  Suicidal thoughts 0 0 0  PHQ-9 Score 4 8 0  Difficult doing work/chores Not difficult at all Not difficult at all Not difficult at all    BP Readings from Last 3 Encounters:  09/23/22 (!) 102/52  09/30/21 116/82  03/17/21 102/76    Physical Exam Vitals and nursing note reviewed.  Constitutional:      General: She is not in acute distress.    Appearance: Normal appearance. She is well-developed.  HENT:     Head: Normocephalic and atraumatic.  Neck:     Vascular: No carotid bruit.  Cardiovascular:     Rate and Rhythm: Normal rate and regular rhythm.  Pulmonary:     Effort: Pulmonary effort is normal. No respiratory distress.  Musculoskeletal:     Cervical back: Normal range of motion.     Right lower leg: No edema.     Left lower leg: No edema.  Lymphadenopathy:     Cervical: No cervical adenopathy.  Skin:    General: Skin is warm and dry.     Capillary Refill: Capillary refill takes less than 2 seconds.     Findings: No rash.  Neurological:     General: No focal deficit present.     Mental Status: She is alert and oriented to person, place, and time.  Psychiatric:        Mood and Affect: Mood normal.        Behavior: Behavior normal.     Wt Readings from Last 3 Encounters:  09/23/22  180 lb (81.6 kg)  09/30/21 161 lb (73 kg)  03/17/21 156 lb (70.8 kg)    BP (!) 102/52   Pulse (!) 56   Ht 5' 3"$  (1.6 m)   Wt 180 lb (81.6 kg)   SpO2 98%   BMI 31.89 kg/m   Assessment and Plan: Problem List Items Addressed This Visit       Cardiovascular and Mediastinum   Essential (primary) hypertension - Primary    Clinically stable exam with well controlled BP on losartan, bisoprolol and amlodipine. Tolerating medications without side effects. Pt to continue current regimen and low sodium diet.       Relevant Orders   CBC with Differential/Platelet   Comprehensive metabolic panel   TSH     Nervous and Auditory   Mild cognitive impairment with memory loss    Seems stable on Namenda and Aricept Lives with her son who is supportive, does the cooking and manages her medications         Other   Dyslipidemia    Tolerating statin medications without concerns LDL is  Lab Results  Component Value Date   LDLCALC 129 (H) 09/30/2021  with a goal of < 70. Current dose will be adjusted if needed.       Relevant Orders   Comprehensive metabolic panel   Lipid panel   Other Visit Diagnoses     Prediabetes       Last A1C was 6.3 a year ago concerned that she is overt DM now with the wt gain DM education given to help her son with meal planning   Relevant Orders   Hemoglobin A1c   Encounter for screening mammogram for breast cancer       Relevant Orders   MM 3D SCREEN BREAST BILATERAL        Partially dictated using Editor, commissioning. Any errors are unintentional.  Halina Maidens, MD [facility]  09/23/2022

## 2022-09-23 NOTE — Assessment & Plan Note (Signed)
On appropriate statin therapy

## 2022-09-23 NOTE — Assessment & Plan Note (Signed)
Seems stable on Namenda and Aricept Lives with her son who is supportive, does the cooking and manages her medications

## 2022-09-23 NOTE — Assessment & Plan Note (Signed)
Clinically stable exam with well controlled BP on losartan, bisoprolol and amlodipine. Tolerating medications without side effects. Pt to continue current regimen and low sodium diet.

## 2022-09-24 LAB — CBC WITH DIFFERENTIAL/PLATELET
Basophils Absolute: 0 10*3/uL (ref 0.0–0.2)
Basos: 1 %
EOS (ABSOLUTE): 0.1 10*3/uL (ref 0.0–0.4)
Eos: 2 %
Hematocrit: 42.6 % (ref 34.0–46.6)
Hemoglobin: 14.9 g/dL (ref 11.1–15.9)
Immature Grans (Abs): 0 10*3/uL (ref 0.0–0.1)
Immature Granulocytes: 0 %
Lymphocytes Absolute: 2.7 10*3/uL (ref 0.7–3.1)
Lymphs: 36 %
MCH: 30.6 pg (ref 26.6–33.0)
MCHC: 35 g/dL (ref 31.5–35.7)
MCV: 88 fL (ref 79–97)
Monocytes Absolute: 0.4 10*3/uL (ref 0.1–0.9)
Monocytes: 6 %
Neutrophils Absolute: 4.1 10*3/uL (ref 1.4–7.0)
Neutrophils: 55 %
Platelets: 224 10*3/uL (ref 150–450)
RBC: 4.87 x10E6/uL (ref 3.77–5.28)
RDW: 12.3 % (ref 11.7–15.4)
WBC: 7.4 10*3/uL (ref 3.4–10.8)

## 2022-09-24 LAB — COMPREHENSIVE METABOLIC PANEL
ALT: 74 IU/L — ABNORMAL HIGH (ref 0–32)
AST: 70 IU/L — ABNORMAL HIGH (ref 0–40)
Albumin/Globulin Ratio: 1.3 (ref 1.2–2.2)
Albumin: 4.3 g/dL (ref 3.8–4.8)
Alkaline Phosphatase: 75 IU/L (ref 44–121)
BUN/Creatinine Ratio: 10 — ABNORMAL LOW (ref 12–28)
BUN: 13 mg/dL (ref 8–27)
Bilirubin Total: 0.7 mg/dL (ref 0.0–1.2)
CO2: 21 mmol/L (ref 20–29)
Calcium: 10.1 mg/dL (ref 8.7–10.3)
Chloride: 106 mmol/L (ref 96–106)
Creatinine, Ser: 1.35 mg/dL — ABNORMAL HIGH (ref 0.57–1.00)
Globulin, Total: 3.2 g/dL (ref 1.5–4.5)
Glucose: 122 mg/dL — ABNORMAL HIGH (ref 70–99)
Potassium: 3.9 mmol/L (ref 3.5–5.2)
Sodium: 144 mmol/L (ref 134–144)
Total Protein: 7.5 g/dL (ref 6.0–8.5)
eGFR: 42 mL/min/{1.73_m2} — ABNORMAL LOW (ref >59)

## 2022-09-24 LAB — HEMOGLOBIN A1C
Est. average glucose Bld gHb Est-mCnc: 134 mg/dL
Hgb A1c MFr Bld: 6.3 % — ABNORMAL HIGH (ref 4.8–5.6)

## 2022-09-24 LAB — LIPID PANEL
Chol/HDL Ratio: 5.8 ratio — ABNORMAL HIGH (ref 0.0–4.4)
Cholesterol, Total: 145 mg/dL (ref 100–199)
HDL: 25 mg/dL — ABNORMAL LOW (ref >39)
LDL Chol Calc (NIH): 69 mg/dL (ref 0–99)
Triglycerides: 320 mg/dL — ABNORMAL HIGH (ref 0–149)
VLDL Cholesterol Cal: 51 mg/dL — ABNORMAL HIGH (ref 5–40)

## 2022-09-24 LAB — TSH: TSH: 3.66 u[IU]/mL (ref 0.450–4.500)

## 2022-09-28 NOTE — Progress Notes (Signed)
3rd attempt labs printed and mailed.  KP

## 2022-10-09 ENCOUNTER — Other Ambulatory Visit: Payer: Self-pay | Admitting: Internal Medicine

## 2022-10-09 DIAGNOSIS — G4700 Insomnia, unspecified: Secondary | ICD-10-CM

## 2022-10-20 ENCOUNTER — Other Ambulatory Visit: Payer: Self-pay | Admitting: Internal Medicine

## 2022-10-20 DIAGNOSIS — R413 Other amnesia: Secondary | ICD-10-CM

## 2022-10-20 NOTE — Telephone Encounter (Signed)
Requested Prescriptions  Pending Prescriptions Disp Refills   donepezil (ARICEPT) 10 MG tablet [Pharmacy Med Name: DONEPEZIL HCL 10 MG TABLET] 90 tablet 0    Sig: TAKE 1 TABLET BY MOUTH EVERYDAY AT BEDTIME     Neurology:  Alzheimer's Agents Passed - 10/20/2022  1:47 AM      Passed - Valid encounter within last 6 months    Recent Outpatient Visits           3 weeks ago Essential (primary) hypertension   [place] [facility] at [facility], Jesse Sans, MD   1 year ago Essential (primary) hypertension   [place] [facility] at [facility], Jesse Sans, MD   1 year ago Essential (primary) hypertension   [place] [facility] at [facility], Jesse Sans, MD   1 year ago Hyponatremia   [facility] Primary [place] at [facility], Jesse Sans, MD   1 year ago Weight loss, unintentional   [place] at [facility], Jesse Sans, MD       Future Appointments             In 3 months Army Melia, Jesse Sans, MD [place] at [facility], [facility]

## 2022-11-22 ENCOUNTER — Other Ambulatory Visit: Payer: Self-pay | Admitting: Internal Medicine

## 2022-12-16 ENCOUNTER — Other Ambulatory Visit: Payer: Self-pay | Admitting: Internal Medicine

## 2022-12-16 NOTE — Telephone Encounter (Signed)
Requested Prescriptions  Pending Prescriptions Disp Refills   losartan (COZAAR) 25 MG tablet [Pharmacy Med Name: LOSARTAN POTASSIUM 25 MG TAB] 180 tablet 0    Sig: TAKE 2 TABLETS BY MOUTH DAILY (REPLACES 50MG  TABLET)     Cardiovascular:  Angiotensin Receptor Blockers Failed - 12/16/2022  2:29 AM      Failed - Cr in normal range and within 180 days    Creatinine, Ser  Date Value Ref Range Status  09/23/2022 1.35 (H) 0.57 - 1.00 mg/dL Final         Passed - K in normal range and within 180 days    Potassium  Date Value Ref Range Status  09/23/2022 3.9 3.5 - 5.2 mmol/L Final         Passed - Patient is not pregnant      Passed - Last BP in normal range    BP Readings from Last 1 Encounters:  09/23/22 (!) 102/52         Passed - Valid encounter within last 6 months    Recent Outpatient Visits           2 months ago Essential (primary) hypertension   [place] [facility] at [facility], Nyoka Cowden, MD   1 year ago Essential (primary) hypertension   [place] [facility] at [facility], Nyoka Cowden, MD   1 year ago Essential (primary) hypertension   [place] [facility] at [facility], Nyoka Cowden, MD   2 years ago Hyponatremia   [facility] Primary Care [facility] at [facility], Nyoka Cowden, MD   2 years ago Weight loss, unintentional   [facility] at [facility], Nyoka Cowden, MD       Future Appointments             In 1 month Judithann Graves, Nyoka Cowden, MD [facility] Primary Care & Sports Medicine at [facility], [facility]

## 2022-12-17 ENCOUNTER — Other Ambulatory Visit: Payer: Self-pay | Admitting: Internal Medicine

## 2022-12-17 DIAGNOSIS — E785 Hyperlipidemia, unspecified: Secondary | ICD-10-CM

## 2023-02-08 ENCOUNTER — Other Ambulatory Visit: Payer: Self-pay | Admitting: Internal Medicine

## 2023-02-08 DIAGNOSIS — G4700 Insomnia, unspecified: Secondary | ICD-10-CM

## 2023-02-09 ENCOUNTER — Encounter: Payer: Medicare Other | Admitting: Internal Medicine

## 2023-02-09 ENCOUNTER — Telehealth: Payer: Self-pay

## 2023-02-09 NOTE — Telephone Encounter (Signed)
Lvm to reschedule appointment

## 2023-02-09 NOTE — Telephone Encounter (Signed)
Called and left message to get scheduled for AWV on Wednesday [facility] nurse A

## 2023-02-28 ENCOUNTER — Other Ambulatory Visit: Payer: Self-pay | Admitting: Internal Medicine

## 2023-02-28 DIAGNOSIS — I10 Essential (primary) hypertension: Secondary | ICD-10-CM

## 2023-03-16 ENCOUNTER — Other Ambulatory Visit: Payer: Self-pay | Admitting: Internal Medicine

## 2023-03-16 DIAGNOSIS — E785 Hyperlipidemia, unspecified: Secondary | ICD-10-CM

## 2023-03-29 IMAGING — CT CT HEAD W/O CM
3 series · 16 of 47 positions shown, 19 images · non-contrast
Comparison: None.

CLINICAL DATA: Headache and confusion. Started new medications 2
days ago.

EXAM:
CT HEAD WITHOUT CONTRAST
TECHNIQUE: Contiguous axial images were obtained from the base of the skull
through the vertex without intravenous contrast.

[Series 2: head wo · axial · 0.44mm/px · z∈[-176,-26]mm · 10 of 36 slices shown, 13 images]
[im 3/36  brain]
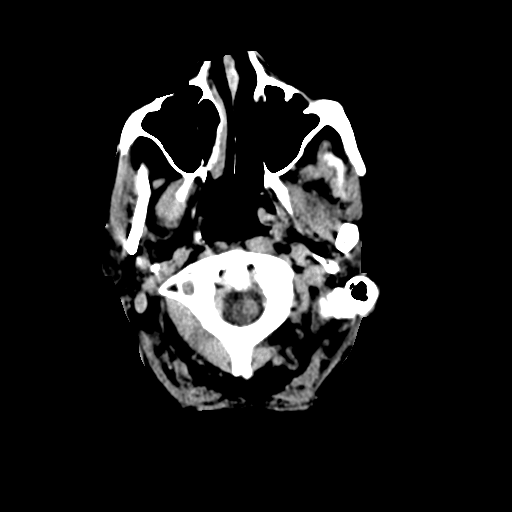
[im 3/36  bone]
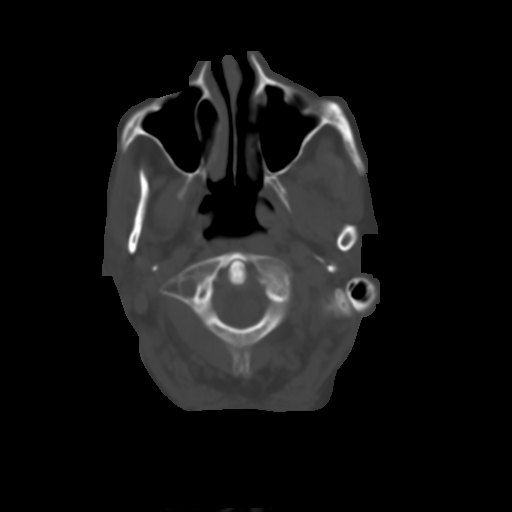
[im 7/36  brain]
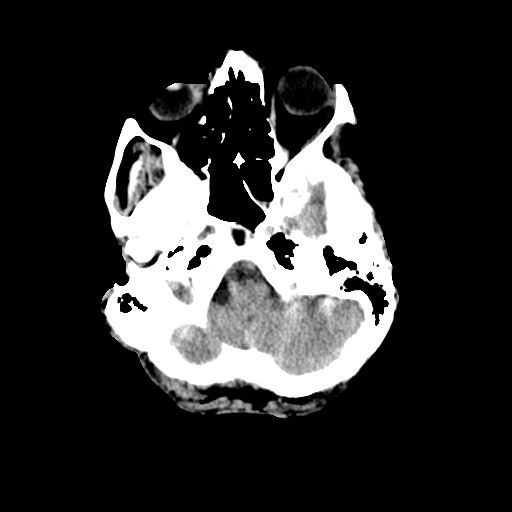
[im 10/36  brain]
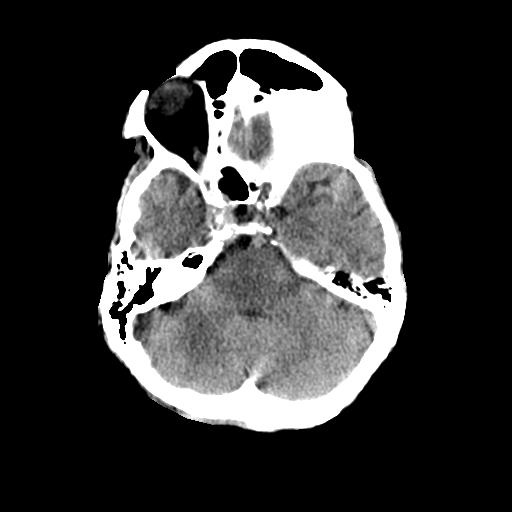
[im 13/36  brain]
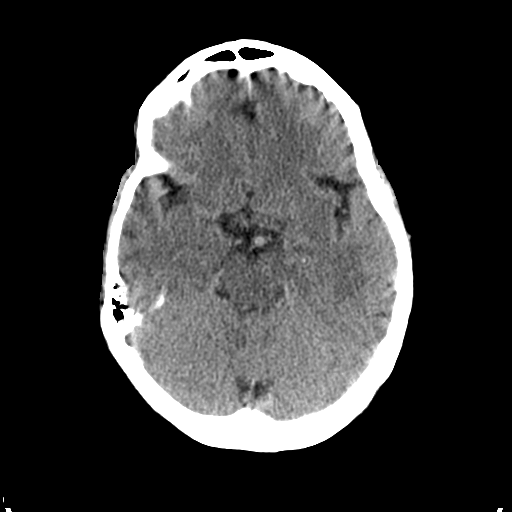
[im 16/36  brain]
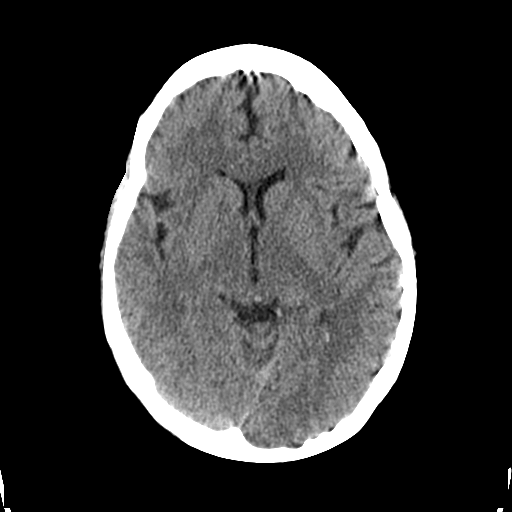
[im 16/36  bone]
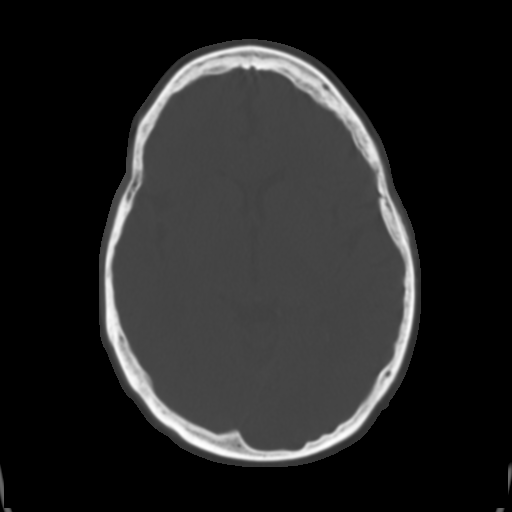
[im 20/36  brain]
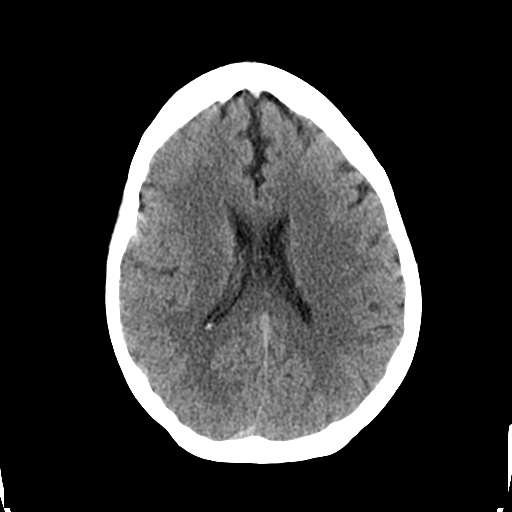
[im 23/36  brain]
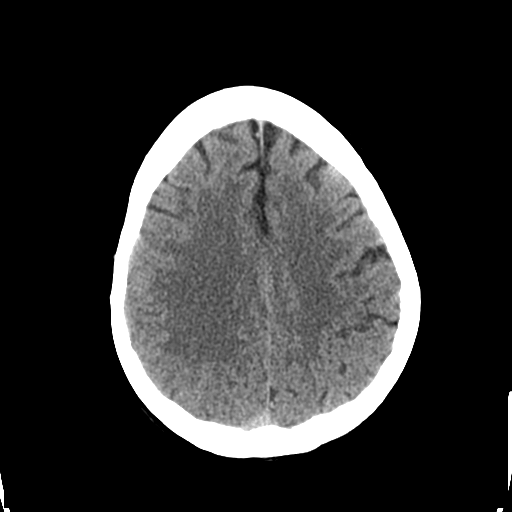
[im 27/36  brain]
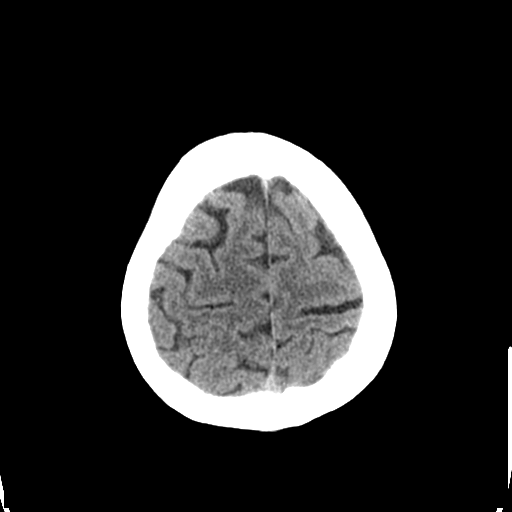
[im 29/36  brain]
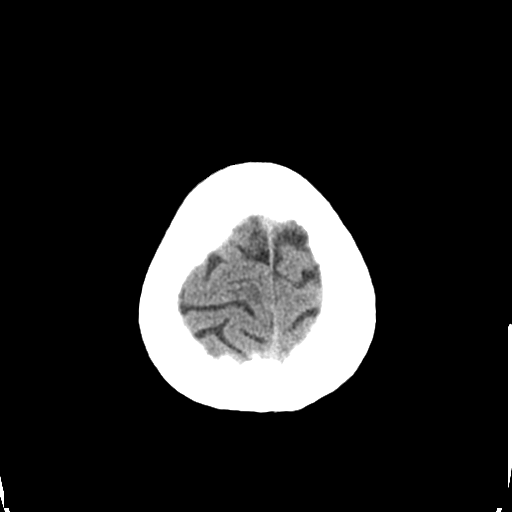
[im 29/36  bone]
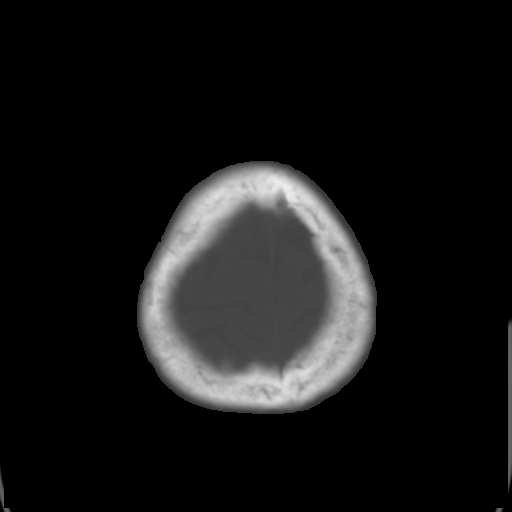
[im 33/36  brain]
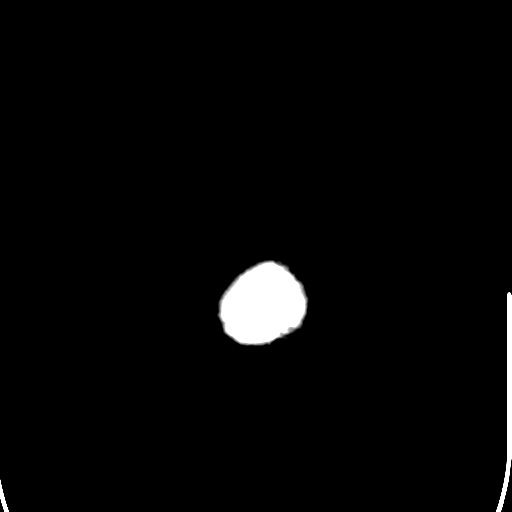

[Series 4: coronal soft tissue · coronal · 0.34mm/px · 3 of 72 slices shown]
[im 24/72  brain]
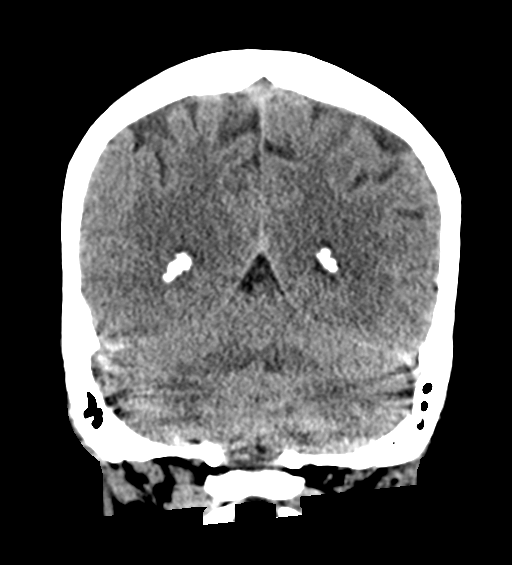
[im 32/72  brain]
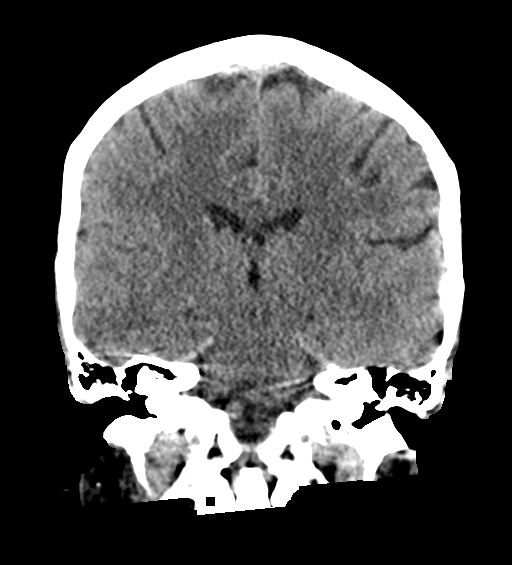
[im 40/72  brain]
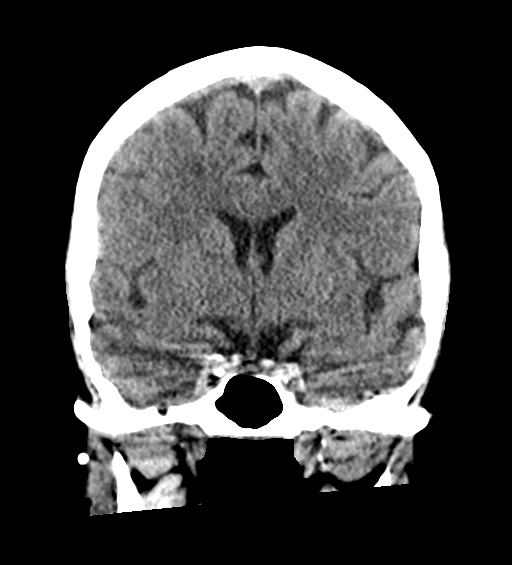

[Series 5: sagittal soft tissue · sagittal · 0.35mm/px · 3 of 58 slices shown]
[im 20/58  brain]
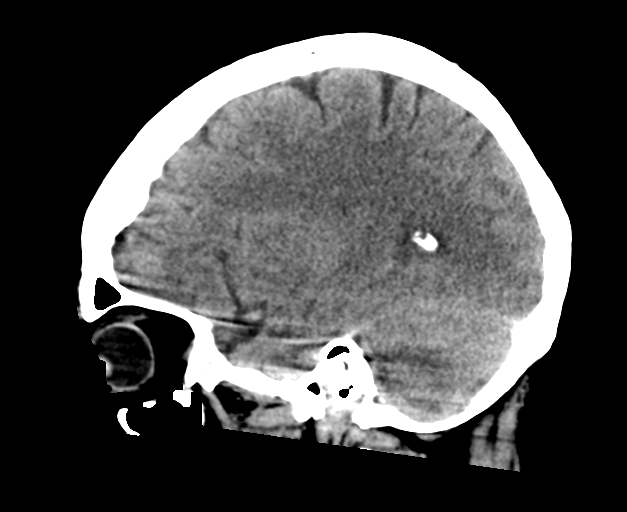
[im 29/58  brain]
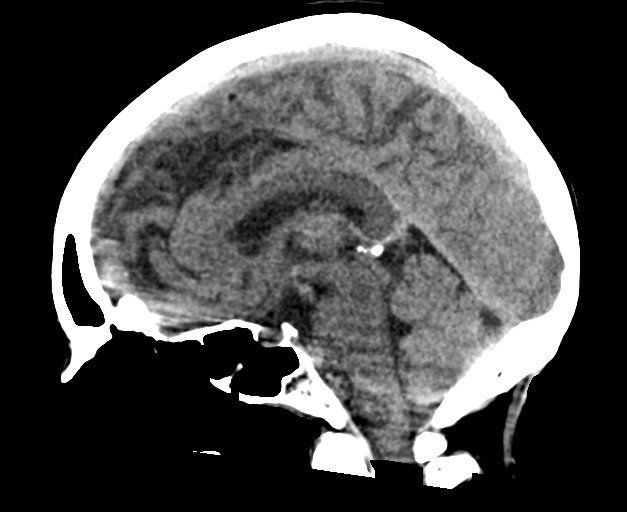
[im 39/58  brain]
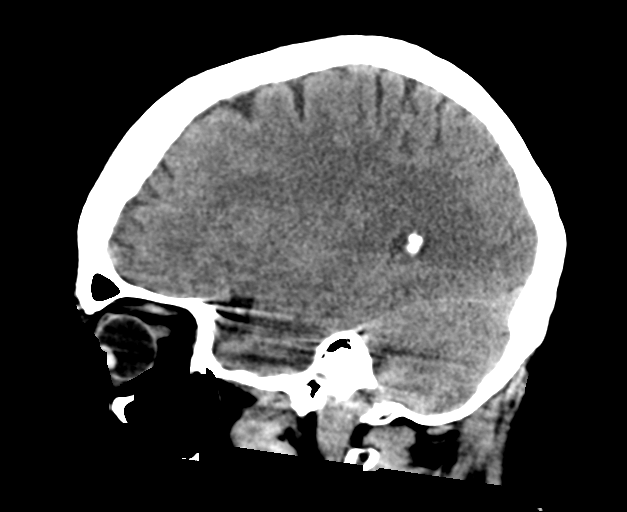

[16 of 47 positions shown; findings below may reference images not displayed]

FINDINGS: Brain: No evidence of acute infarction, hemorrhage, hydrocephalus,
extra-axial collection or mass lesion/mass effect. Mild cerebral
atrophy. Patchy low-attenuation changes in the deep white matter
suggesting small vessel ischemic change.

Vascular: Intracranial arterial vascular calcifications are present.

Skull: Calvarium appears intact.

Sinuses/Orbits: Paranasal sinuses and mastoid air cells are clear.

Other: None.
IMPRESSION: 1. No acute intracranial abnormalities.
2. Mild cerebral atrophy and small vessel ischemic changes.

## 2023-05-10 ENCOUNTER — Telehealth: Payer: Self-pay | Admitting: Internal Medicine

## 2023-05-10 NOTE — Telephone Encounter (Signed)
Copied from CRM [id number]. Topic: Medicare AWV >> May 10, 2023  2:30 PM Payton Doughty wrote: Reason for CRM: Called LVM 05/10/2023 to schedule AWV   Verlee Rossetti; Care Guide Ambulatory Clinical Support [place] [facility] Direct Dial: [phone]

## 2023-06-02 ENCOUNTER — Ambulatory Visit
Admission: RE | Admit: 2023-06-02 | Discharge: 2023-06-02 | Disposition: A | Discharge status: Home / Self Care | Payer: Medicare Other | Source: Ambulatory Visit | Attending: Internal Medicine | Admitting: Internal Medicine

## 2023-06-02 DIAGNOSIS — Z1231 Encounter for screening mammogram for malignant neoplasm of breast: Secondary | ICD-10-CM | POA: Diagnosis not present

## 2023-06-08 ENCOUNTER — Other Ambulatory Visit: Payer: Self-pay | Admitting: Internal Medicine

## 2023-06-09 NOTE — Telephone Encounter (Signed)
30 day courtesy refill given. Pt was sent message to call office and make appt. < 30 months overdue //pt given 30 day courtesy refill Requested Prescriptions  Pending Prescriptions Disp Refills   memantine (NAMENDA) 5 MG tablet [Pharmacy Med Name: MEMANTINE HCL 5 MG TABLET] 180 tablet 0    Sig: TAKE 1 TABLET BY MOUTH TWICE A DAY     Neurology:  Alzheimer's Agents 2 Failed - 06/08/2023  1:35 AM      Failed - Cr in normal range and within 360 days    Creatinine, Ser  Date Value Ref Range Status  09/23/2022 1.35 (H) 0.57 - 1.00 mg/dL Final         Failed - Valid encounter within last 6 months    Recent Outpatient Visits           8 months ago Essential (primary) hypertension   [place] [facility] at [facility], Nyoka Cowden, MD   1 year ago Essential (primary) hypertension   [place] [facility] at [facility], Nyoka Cowden, MD   2 years ago Essential (primary) hypertension   [place] [facility] Care & [facility] at [facility], Nyoka Cowden, MD   2 years ago Hyponatremia   [facility] Primary Care & Sports [facility] at [facility], Nyoka Cowden, MD   2 years ago Weight loss, unintentional   [facility] Primary Care & Sports Medicine at [facility], Nyoka Cowden, MD              Passed - eGFR is 5 or above and within 360 days    GFR calc Af Amer  Date Value Ref Range Status  09/28/2019 92 >59 mL/min/1.73 Final   GFR, Estimated  Date Value Ref Range Status  11/05/2020 >60 >60 mL/min Final    Comment:    (NOTE) Calculated using the CKD-EPI Creatinine Equation (2021)    eGFR  Date Value Ref Range Status  09/23/2022 42 (L) >59 mL/min/1.73 Final

## 2023-06-23 ENCOUNTER — Other Ambulatory Visit: Payer: Self-pay | Admitting: Internal Medicine

## 2023-06-24 ENCOUNTER — Other Ambulatory Visit: Payer: Self-pay | Admitting: Internal Medicine

## 2023-06-24 DIAGNOSIS — E785 Hyperlipidemia, unspecified: Secondary | ICD-10-CM

## 2023-06-24 NOTE — Telephone Encounter (Signed)
Requested medication (s) are due for refill today: yes  Requested medication (s) are on the active medication list: yes  Last refill:  03/16/23 #180  Future visit scheduled: no  Notes to clinic:  called pt and LM on VM. Was going to give a 30 day refill but labs are overdue   Requested Prescriptions  Pending Prescriptions Disp Refills   losartan (COZAAR) 25 MG tablet [Pharmacy Med Name: LOSARTAN POTASSIUM 25 MG TAB] 180 tablet 0    Sig: TAKE 2 TABLETS BY MOUTH DAILY (REPLACES 50MG  TABLET)     Cardiovascular:  Angiotensin Receptor Blockers Failed - 06/23/2023  1:16 AM      Failed - Cr in normal range and within 180 days    Creatinine, Ser  Date Value Ref Range Status  09/23/2022 1.35 (H) 0.57 - 1.00 mg/dL Final         Failed - K in normal range and within 180 days    Potassium  Date Value Ref Range Status  09/23/2022 3.9 3.5 - 5.2 mmol/L Final         Failed - Valid encounter within last 6 months    Recent Outpatient Visits           9 months ago Essential (primary) hypertension   [place] [facility] at [facility], Nyoka Cowden, MD   1 year ago Essential (primary) hypertension   [place] [facility] at [facility], Nyoka Cowden, MD   2 years ago Essential (primary) hypertension   [place] [facility] at [facility], Nyoka Cowden, MD   2 years ago Hyponatremia   [facility] Care & Sports [facility] at [facility], Nyoka Cowden, MD   2 years ago Weight loss, unintentional   [facility] Primary Care & Sports Medicine at [facility], Nyoka Cowden, MD              Passed - Patient is not pregnant      Passed - Last BP in normal range    BP Readings from Last 1 Encounters:  09/23/22 (!) 102/52

## 2023-06-24 NOTE — Telephone Encounter (Signed)
Requested by interface surescripts.  Requested Prescriptions  Pending Prescriptions Disp Refills   atorvastatin (LIPITOR) 20 MG tablet [Pharmacy Med Name: ATORVASTATIN 20 MG TABLET] 90 tablet 0    Sig: TAKE 1 TABLET BY MOUTH DAILY AT 6 PM.     Cardiovascular:  Antilipid - Statins Failed - 06/24/2023  1:28 AM      Failed - Lipid Panel in normal range within the last 12 months    Cholesterol, Total  Date Value Ref Range Status  09/23/2022 145 100 - 199 mg/dL Final   LDL Chol Calc (NIH)  Date Value Ref Range Status  09/23/2022 69 0 - 99 mg/dL Final   HDL  Date Value Ref Range Status  09/23/2022 25 (L) >39 mg/dL Final   Triglycerides  Date Value Ref Range Status  09/23/2022 320 (H) 0 - 149 mg/dL Final         Passed - Patient is not pregnant      Passed - Valid encounter within last 12 months    Recent Outpatient Visits           9 months ago Essential (primary) hypertension   [place] [facility] at [facility], Nyoka Cowden, MD   1 year ago Essential (primary) hypertension    Primary Care & Sports Medicine at [facility], Nyoka Cowden, MD   2 years ago Essential (primary) hypertension    Primary Care & Sports Medicine at [facility], Nyoka Cowden, MD   2 years ago Hyponatremia   [facility] Care & Sports [facility] at [facility], Nyoka Cowden, MD   2 years ago Weight loss, unintentional   [facility] Primary Care & Sports Medicine at [facility], Nyoka Cowden, MD

## 2023-07-09 ENCOUNTER — Other Ambulatory Visit: Payer: Self-pay | Admitting: Internal Medicine

## 2023-07-09 NOTE — Telephone Encounter (Signed)
Requested Prescriptions  Pending Prescriptions Disp Refills   memantine (NAMENDA) 5 MG tablet [Pharmacy Med Name: MEMANTINE HCL 5 MG TABLET] 180 tablet 0    Sig: TAKE 1 TABLET BY MOUTH TWICE A DAY     Neurology:  Alzheimer's Agents 2 Failed - 07/09/2023  1:43 AM      Failed - Cr in normal range and within 360 days    Creatinine, Ser  Date Value Ref Range Status  09/23/2022 1.35 (H) 0.57 - 1.00 mg/dL Final         Failed - Valid encounter within last 6 months    Recent Outpatient Visits           9 months ago Essential (primary) hypertension   [place] [facility] Medicine at [facility], Nyoka Cowden, MD   1 year ago Essential (primary) hypertension   [place] [facility] at [facility], Nyoka Cowden, MD   2 years ago Essential (primary) hypertension   [place] [facility] at [facility], Nyoka Cowden, MD   2 years ago Hyponatremia   [facility] Health Primary Care & Sports Medicine at [facility], Nyoka Cowden, MD   2 years ago Weight loss, unintentional   [facility] Primary Care & Sports [facility] at [facility], Nyoka Cowden, MD              Passed - eGFR is 5 or above and within 360 days    GFR calc Af Amer  Date Value Ref Range Status  09/28/2019 92 >59 mL/min/1.73 Final   GFR, Estimated  Date Value Ref Range Status  11/05/2020 >60 >60 mL/min Final    Comment:    (NOTE) Calculated using the CKD-EPI Creatinine Equation (2021)    eGFR  Date Value Ref Range Status  09/23/2022 42 (L) >59 mL/min/1.73 Final

## 2023-07-20 ENCOUNTER — Other Ambulatory Visit: Payer: Self-pay | Admitting: Internal Medicine

## 2023-07-20 NOTE — Telephone Encounter (Signed)
Requested medications are due for refill today.  yes  Requested medications are on the active medications list.  yes  Last refill. 06/24/2023 #60 0 rf  Future visit scheduled.   no  Notes to clinic.  Pt is more than 3 months overdue for an OV.    Requested Prescriptions  Pending Prescriptions Disp Refills   losartan (COZAAR) 25 MG tablet [Pharmacy Med Name: LOSARTAN POTASSIUM 25 MG TAB] 180 tablet 1    Sig: TAKE 2 TABLETS BY MOUTH DAILY (REPLACES 50MG  TABLET)     Cardiovascular:  Angiotensin Receptor Blockers Failed - 07/20/2023  5:14 PM      Failed - Cr in normal range and within 180 days    Creatinine, Ser  Date Value Ref Range Status  09/23/2022 1.35 (H) 0.57 - 1.00 mg/dL Final         Failed - K in normal range and within 180 days    Potassium  Date Value Ref Range Status  09/23/2022 3.9 3.5 - 5.2 mmol/L Final         Failed - Valid encounter within last 6 months    Recent Outpatient Visits           10 months ago Essential (primary) hypertension   [place] [facility] Care [facility] Medicine at [facility], Nyoka Cowden, MD   1 year ago Essential (primary) hypertension   [place] [facility] Medicine at [facility], Nyoka Cowden, MD   2 years ago Essential (primary) hypertension   [place] [facility] Care [facility] Medicine at [facility], Nyoka Cowden, MD   2 years ago Hyponatremia   [facility] at [facility], Nyoka Cowden, MD   2 years ago Weight loss, unintentional   [facility] Primary Care & Sports Medicine at [facility], Nyoka Cowden, MD              Passed - Patient is not pregnant      Passed - Last BP in normal range    BP Readings from Last 1 Encounters:  09/23/22 (!) 102/52

## 2023-07-29 ENCOUNTER — Other Ambulatory Visit: Payer: Self-pay | Admitting: Internal Medicine

## 2023-07-30 ENCOUNTER — Telehealth: Payer: Self-pay | Admitting: Internal Medicine

## 2023-07-30 ENCOUNTER — Other Ambulatory Visit: Payer: Self-pay

## 2023-07-30 DIAGNOSIS — I10 Essential (primary) hypertension: Secondary | ICD-10-CM

## 2023-07-30 MED ORDER — LOSARTAN POTASSIUM 25 MG PO TABS: 25.0000 mg | ORAL_TABLET | Freq: Two times a day (BID) | ORAL | 0 refills | Status: DC

## 2023-07-30 NOTE — Telephone Encounter (Signed)
Called son let him know a refill was sent in.  [place]

## 2023-07-30 NOTE — Telephone Encounter (Signed)
   Medication:  losartan (COZAAR) 25 MG tablet   Caller states patient is a few hours away and go through financial difficulty therefore unable to schedule an in office appointment. Caller states patient has been without medication for 3 days.     Has the patient contacted their pharmacy? Yes, Contact PCP    Is this the correct pharmacy for this prescription? No    [id number] [place] [address] Phone: [phone]  Fax: [phone]      Agent: Please be advised that Rx refills may take up to 3 business days. We ask that you follow-up with your pharmacy.

## 2023-09-11 ENCOUNTER — Other Ambulatory Visit: Payer: Self-pay | Admitting: Internal Medicine

## 2023-09-11 DIAGNOSIS — I10 Essential (primary) hypertension: Secondary | ICD-10-CM

## 2023-09-13 NOTE — Telephone Encounter (Signed)
 Requested medication (s) are due for refill today - yes  Requested medication (s) are on the active medication list -yes  Future visit scheduled -no  Last refill: both- 03/01/23 #90 1RF  Notes to clinic: Patient fails visit protocol- attempted to call- but no answer and unable to leave message. Needs yearly visit scheduled  Requested Prescriptions  Pending Prescriptions Disp Refills   bisoprolol  (ZEBETA ) 10 MG tablet [Pharmacy Med Name: BISOPROLOL  FUMARATE 10 MG TAB] 90 tablet 1    Sig: TAKE 1 TABLET BY MOUTH EVERY DAY     Cardiovascular: Beta Blockers 2 Failed - 09/13/2023 11:32 AM      Failed - Cr in normal range and within 360 days    Creatinine, Ser  Date Value Ref Range Status  09/23/2022 1.35 (H) 0.57 - 1.00 mg/dL Final         Failed - Valid encounter within last 6 months    Recent Outpatient Visits           11 months ago Essential (primary) hypertension   [place] [facility] at [facility], Chales [place], MD   1 year ago Essential (primary) hypertension   [place] [facility] at [facility], Chales [place], MD   2 years ago Essential (primary) hypertension   [place] [facility] at [place], Chales [place], MD   2 years ago Hyponatremia   [facility] at [facility], Chales [place], MD   2 years ago Weight loss, unintentional   [facility] at [facility], Chales [place], MD              Passed - Last BP in normal range    BP Readings from Last 1 Encounters:  09/23/22 (!) 102/52         Passed - Last Heart Rate in normal range    Pulse Readings from Last 1 Encounters:  09/23/22 (!) 56          amLODipine  (NORVASC ) 5 MG tablet [Pharmacy Med Name: AMLODIPINE  BESYLATE 5 MG TAB] 90 tablet 1    Sig: TAKE 1 TABLET (5 MG TOTAL) BY MOUTH DAILY.     Cardiovascular: Calcium  Channel Blockers 2 Failed  - 09/13/2023 11:32 AM      Failed - Valid encounter within last 6 months    Recent Outpatient Visits           11 months ago Essential (primary) hypertension   [place] [facility] at [facility], Chales [place], MD   1 year ago Essential (primary) hypertension   [place] [facility] at [facility], Chales [place], MD   2 years ago Essential (primary) hypertension   [place] [facility] at [facility], Chales [place], MD   2 years ago Hyponatremia   [facility] at [facility], Chales [place], MD   2 years ago Weight loss, unintentional   [facility] at [facility], Chales [place], MD              Passed - Last BP in normal range    BP Readings from Last 1 Encounters:  09/23/22 (!) 102/52         Passed - Last Heart Rate in normal range  Pulse Readings from Last 1 Encounters:  09/23/22 (!) 56            Requested Prescriptions  Pending Prescriptions Disp Refills   bisoprolol  (ZEBETA ) 10 MG tablet [Pharmacy Med Name: BISOPROLOL  FUMARATE 10 MG TAB] 90 tablet 1    Sig: TAKE 1 TABLET BY MOUTH EVERY DAY     Cardiovascular: Beta Blockers 2 Failed - 09/13/2023 11:32 AM      Failed - Cr in normal range and within 360 days    Creatinine, Ser  Date Value Ref Range Status  09/23/2022 1.35 (H) 0.57 - 1.00 mg/dL Final         Failed - Valid encounter within last 6 months    Recent Outpatient Visits           11 months ago Essential (primary) hypertension   [place] [facility] at [facility], Chales [place], MD   1 year ago Essential (primary) hypertension   [place] [facility] at [facility], Chales [place], MD   2 years ago Essential (primary) hypertension   [place] [facility] at [facility], Chales [place], MD   2 years  ago Hyponatremia   [facility] at [facility], Chales [place], MD   2 years ago Weight loss, unintentional   [facility] & Sports Medicine at [facility], Chales [place], MD              Passed - Last BP in normal range    BP Readings from Last 1 Encounters:  09/23/22 (!) 102/52         Passed - Last Heart Rate in normal range    Pulse Readings from Last 1 Encounters:  09/23/22 (!) 56          amLODipine  (NORVASC ) 5 MG tablet [Pharmacy Med Name: AMLODIPINE  BESYLATE 5 MG TAB] 90 tablet 1    Sig: TAKE 1 TABLET (5 MG TOTAL) BY MOUTH DAILY.     Cardiovascular: Calcium  Channel Blockers 2 Failed - 09/13/2023 11:32 AM      Failed - Valid encounter within last 6 months    Recent Outpatient Visits           11 months ago Essential (primary) hypertension   [place] [facility] at [facility], Chales [place], MD   1 year ago Essential (primary) hypertension   [place] [facility] at [facility], Chales [place], MD   2 years ago Essential (primary) hypertension   [place] [facility] at [facility], Chales [place], MD   2 years ago Hyponatremia   [facility] at [facility], Chales [place], MD   2 years ago Weight loss, unintentional   [facility] at [facility], Chales [place], MD              Passed - Last BP in normal range    BP Readings from Last 1 Encounters:  09/23/22 (!) 102/52         Passed - Last Heart Rate in normal range    Pulse Readings from Last 1 Encounters:  09/23/22 (!) 56

## 2023-09-27 ENCOUNTER — Other Ambulatory Visit: Payer: Self-pay | Admitting: Internal Medicine

## 2023-09-27 DIAGNOSIS — I10 Essential (primary) hypertension: Secondary | ICD-10-CM

## 2023-09-28 NOTE — Telephone Encounter (Signed)
 Requested medications are due for refill today.  yes  Requested medications are on the active medications list.  yes  Last refill. 09/13/2023 #30 0 rf  Future visit scheduled.   no  Notes to clinic.  Pt is more than 3 months overdue for an ov. Pt already given a courtesy refill.    Requested Prescriptions  Pending Prescriptions Disp Refills   bisoprolol (ZEBETA) 10 MG tablet [Pharmacy Med Name: BISOPROLOL FUMARATE 10 MG TAB] 90 tablet 1    Sig: TAKE 1 TABLET BY MOUTH EVERY DAY     Cardiovascular: Beta Blockers 2 Failed - 09/28/2023 12:19 PM      Failed - Cr in normal range and within 360 days    Creatinine, Ser  Date Value Ref Range Status  09/23/2022 1.35 (H) 0.57 - 1.00 mg/dL Final         Failed - Valid encounter within last 6 months    Recent Outpatient Visits           1 year ago Essential (primary) hypertension   [place] [facility] at [facility], Nyoka Cowden, MD   1 year ago Essential (primary) hypertension   [place] [facility] at [facility], Nyoka Cowden, MD   2 years ago Essential (primary) hypertension   [place] [facility] at [facility], Nyoka Cowden, MD   2 years ago Hyponatremia   [facility] at [facility], Nyoka Cowden, MD   2 years ago Weight loss, unintentional   [facility] Primary [facility] & Sports Medicine at [facility], Nyoka Cowden, MD              Passed - Last BP in normal range    BP Readings from Last 1 Encounters:  09/23/22 (!) 102/52         Passed - Last Heart Rate in normal range    Pulse Readings from Last 1 Encounters:  09/23/22 (!) 56          amLODipine (NORVASC) 5 MG tablet [Pharmacy Med Name: AMLODIPINE BESYLATE 5 MG TAB] 90 tablet 1    Sig: TAKE 1 TABLET (5 MG TOTAL) BY MOUTH DAILY.     Cardiovascular: Calcium Channel Blockers 2 Failed - 09/28/2023 12:19 PM      Failed - Valid  encounter within last 6 months    Recent Outpatient Visits           1 year ago Essential (primary) hypertension   [place] [facility] at [facility], Nyoka Cowden, MD   1 year ago Essential (primary) hypertension   [place] [facility] at [facility], Nyoka Cowden, MD   2 years ago Essential (primary) hypertension    Primary [facility] at [facility], Nyoka Cowden, MD   2 years ago Hyponatremia   [facility] at [facility], Nyoka Cowden, MD   2 years ago Weight loss, unintentional   [facility] at [facility], Nyoka Cowden, MD              Passed - Last BP in normal range    BP Readings from Last 1 Encounters:  09/23/22 (!) 102/52         Passed - Last Heart Rate in normal range  Pulse Readings from Last 1 Encounters:  09/23/22 (!) 56

## 2023-10-23 ENCOUNTER — Other Ambulatory Visit: Payer: Self-pay | Admitting: Internal Medicine

## 2023-10-23 DIAGNOSIS — I10 Essential (primary) hypertension: Secondary | ICD-10-CM

## 2023-10-25 NOTE — Telephone Encounter (Signed)
Requested medications are due for refill today.  yes  Requested medications are on the active medications list.  yes  Last refill. 09/13/2023 for both #30 0 rf  Future visit scheduled.   no  Notes to clinic.  Pt last seen  09/23/2022.    Requested Prescriptions  Pending Prescriptions Disp Refills   amLODipine (NORVASC) 5 MG tablet [Pharmacy Med Name: AMLODIPINE BESYLATE 5 MG TAB] 30 tablet 0    Sig: TAKE 1 TABLET (5 MG TOTAL) BY MOUTH DAILY.     Cardiovascular: Calcium Channel Blockers 2 Failed - 10/25/2023 12:51 PM      Failed - Valid encounter within last 6 months    Recent Outpatient Visits           1 year ago Essential (primary) hypertension   [place] [facility] at [facility], Nyoka Cowden, MD   2 years ago Essential (primary) hypertension   [place] [facility] at [facility], Nyoka Cowden, MD   2 years ago Essential (primary) hypertension   [place] [facility] Medicine at [facility], Nyoka Cowden, MD   2 years ago Hyponatremia   [facility] Sports [facility] at [facility], Nyoka Cowden, MD   2 years ago Weight loss, unintentional   [facility] Care & [facility] Medicine at [facility], Nyoka Cowden, MD              Passed - Last BP in normal range    BP Readings from Last 1 Encounters:  09/23/22 (!) 102/52         Passed - Last Heart Rate in normal range    Pulse Readings from Last 1 Encounters:  09/23/22 (!) 56          bisoprolol (ZEBETA) 10 MG tablet [Pharmacy Med Name: BISOPROLOL FUMARATE 10 MG TAB] 30 tablet 0    Sig: TAKE 1 TABLET BY MOUTH EVERY DAY     Cardiovascular: Beta Blockers 2 Failed - 10/25/2023 12:51 PM      Failed - Cr in normal range and within 360 days    Creatinine, Ser  Date Value Ref Range Status  09/23/2022 1.35 (H) 0.57 - 1.00 mg/dL Final         Failed - Valid encounter within last 6 months    Recent  Outpatient Visits           1 year ago Essential (primary) hypertension   [place] [facility] at [facility], Nyoka Cowden, MD   2 years ago Essential (primary) hypertension   [place] [facility] at [facility], Nyoka Cowden, MD   2 years ago Essential (primary) hypertension   [place] [facility] at [facility], Nyoka Cowden, MD   2 years ago Hyponatremia   [facility] Health Primary Care & Sports Medicine at [facility], Nyoka Cowden, MD   2 years ago Weight loss, unintentional   [facility] Primary [facility] Sports Medicine at [facility], Nyoka Cowden, MD              Passed - Last BP in normal range    BP Readings from Last 1 Encounters:  09/23/22 (!) 102/52         Passed - Last Heart Rate in normal range    Pulse Readings from Last 1 Encounters:  09/23/22 (!) 56           

## 2023-10-28 ENCOUNTER — Other Ambulatory Visit: Payer: Self-pay | Admitting: Internal Medicine

## 2023-10-28 DIAGNOSIS — I10 Essential (primary) hypertension: Secondary | ICD-10-CM

## 2023-10-29 ENCOUNTER — Telehealth: Payer: Self-pay | Admitting: Internal Medicine

## 2023-10-29 NOTE — Telephone Encounter (Signed)
 Copied from CRM [id number]. Topic: General - Other >> Oct 29, 2023  4:40 PM Emylou G wrote: Reason for CRM: please call son.. [phone]. They live really far away.. to be seen.. but want to make sure why their meds are being denied?

## 2023-11-01 NOTE — Telephone Encounter (Signed)
 Please schedule pt an appointment. Pt needs to be seen every 6 months for [place] to monitor BP and other health conditions. Hasn't been seen since 09/2022.  KP

## 2023-11-03 ENCOUNTER — Encounter: Payer: Self-pay | Admitting: Internal Medicine

## 2023-11-03 ENCOUNTER — Ambulatory Visit: Admitting: Internal Medicine

## 2023-11-03 NOTE — Progress Notes (Deleted)
 Date:  11/03/2023   Name:  Casey Lara   DOB:  Jul 29, 1951   MRN:  [id number]   Chief Complaint: No chief complaint on file.  Hypertension This is a chronic problem. The problem is controlled. Past treatments include beta blockers, calcium channel blockers and angiotensin blockers. Hypertensive end-organ damage includes kidney disease. There is no history of CAD/MI.  Hyperlipidemia This is a chronic problem. Current antihyperlipidemic treatment includes statins.  Cognitive decline - on Namenda and Aricept.  Lives with her son who manages her medications, meals and transportation.  Review of Systems   Lab Results  Component Value Date   NA 144 09/23/2022   K 3.9 09/23/2022   CO2 21 09/23/2022   GLUCOSE 122 (H) 09/23/2022   BUN 13 09/23/2022   CREATININE 1.35 (H) 09/23/2022   CALCIUM 10.1 09/23/2022   EGFR 42 (L) 09/23/2022   GFRNONAA >60 11/05/2020   Lab Results  Component Value Date   CHOL 145 09/23/2022   HDL 25 (L) 09/23/2022   LDLCALC 69 09/23/2022   TRIG 320 (H) 09/23/2022   CHOLHDL 5.8 (H) 09/23/2022   Lab Results  Component Value Date   TSH 3.660 09/23/2022   Lab Results  Component Value Date   HGBA1C 6.3 (H) 09/23/2022   Lab Results  Component Value Date   WBC 7.4 09/23/2022   HGB 14.9 09/23/2022   HCT 42.6 09/23/2022   MCV 88 09/23/2022   PLT 224 09/23/2022   Lab Results  Component Value Date   ALT 74 (H) 09/23/2022   AST 70 (H) 09/23/2022   ALKPHOS 75 09/23/2022   BILITOT 0.7 09/23/2022   No results found for: "25OHVITD2", "25OHVITD3", "VD25OH"   Patient Active Problem List   Diagnosis Date Noted   Pulmonary nodules 11/03/2020   Aortic atherosclerosis (HCC) 10/01/2020   Mild cognitive impairment with memory loss 12/23/2018   Tobacco use disorder, severe, in early remission 06/19/2016   Adenomatous colon polyp 06/19/2015   Constipation 06/19/2015   Bilateral low back pain with left-sided sciatica 06/19/2015   H/O varicella  04/12/2015   Obstructive apnea 02/11/2015   History of malignant melanoma of skin 02/11/2015   Dyslipidemia 02/11/2015   Allergy 02/11/2015   Essential (primary) hypertension 02/11/2015   Generalized anxiety disorder 02/11/2015   Insomnia, persistent 02/11/2015   CMC arthritis, thumb, degenerative 02/11/2015    Allergies  Allergen Reactions   Mirtazapine Other (See Comments)    Hyponatremia   Hctz [Hydrochlorothiazide] Other (See Comments)    hyponatremia    Past Surgical History:  Procedure Laterality Date   CARPAL TUNNEL RELEASE Left 2014   COLONOSCOPY  05/2013   serrated adenoma   LUMBAR DISC SURGERY     MELANOMA EXCISION  2015    Social History   Tobacco Use   Smoking status: Former    Current packs/day: 0.00    Average packs/day: 1 pack/day for 54.2 years (54.2 ttl pk-yrs)    Types: Cigarettes    Start date: 08/03/1966    Quit date: 11/01/2020    Years since quitting: 3.0   Smokeless tobacco: Never   Tobacco comments:    smoking since age 17  1 pack daily   Vaping Use   Vaping status: Never Used  Substance Use Topics   Alcohol use: No    Alcohol/week: 0.0 standard drinks of alcohol   Drug use: No     Medication list has been reviewed and updated.  No outpatient medications have been marked  as taking for the 11/03/23 encounter (Appointment) with Reubin Milan, MD.       09/23/2022    4:04 PM 09/30/2021    4:07 PM 03/17/2021    1:38 PM 11/13/2020    3:40 PM  GAD 7 : Generalized Anxiety Score  Nervous, Anxious, on Edge 0 0 0 0  Control/stop worrying 0 0 0 0  Worry too much - different things 0 0 0 0  Trouble relaxing 0 0 0 0  Restless 0 0 0 0  Easily annoyed or irritable 0 0 0 0  Afraid - awful might happen 0 0 0 0  Total GAD 7 Score 0 0 0 0  Anxiety Difficulty Not difficult at all Not difficult at all  Not difficult at all       09/23/2022    4:04 PM 09/30/2021    4:07 PM 03/17/2021    1:38 PM  Depression screen PHQ 2/9  Decreased Interest 2 2  0  Down, Depressed, Hopeless 0 2 0  PHQ - 2 Score 2 4 0  Altered sleeping 2 0 0  Tired, decreased energy 0 2 0  Change in appetite 0 0 0  Feeling bad or failure about yourself  0 0 0  Trouble concentrating 0 2 0  Moving slowly or fidgety/restless 0 0 0  Suicidal thoughts 0 0 0  PHQ-9 Score 4 8 0  Difficult doing work/chores Not difficult at all Not difficult at all Not difficult at all    BP Readings from Last 3 Encounters:  09/23/22 (!) 102/52  09/30/21 116/82  03/17/21 102/76    Physical Exam  Wt Readings from Last 3 Encounters:  09/23/22 180 lb (81.6 kg)  09/30/21 161 lb (73 kg)  03/17/21 156 lb (70.8 kg)    There were no vitals taken for this visit.  Assessment and Plan:  Problem List Items Addressed This Visit       Unprioritized   History of malignant melanoma of skin (Chronic)   Dyslipidemia (Chronic)   Essential (primary) hypertension - Primary (Chronic)   Generalized anxiety disorder (Chronic)   Long standing mild to moderate symptoms  She uses Valium PRN - last Rx 02/2023      Mild cognitive impairment with memory loss (Chronic)   Stable symptoms with family support On Namenda and Aricept        No follow-ups on file.    Reubin Milan, MD [facility] Health Primary Care and Sports Medicine [place]

## 2023-11-03 NOTE — Assessment & Plan Note (Deleted)
 Stable symptoms with family support On Namenda and Aricept

## 2023-11-03 NOTE — Assessment & Plan Note (Deleted)
 Long standing mild to moderate symptoms  She uses Valium PRN - last Rx 02/2023

## 2023-11-12 ENCOUNTER — Ambulatory Visit (INDEPENDENT_AMBULATORY_CARE_PROVIDER_SITE_OTHER): Admitting: Internal Medicine

## 2023-11-12 ENCOUNTER — Encounter: Payer: Self-pay | Admitting: Internal Medicine

## 2023-11-12 VITALS — BP 120/78 | HR 83 | Ht 63.0 in | Wt 159.2 lb

## 2023-11-12 DIAGNOSIS — G3184 Mild cognitive impairment, so stated: Secondary | ICD-10-CM

## 2023-11-12 DIAGNOSIS — F411 Generalized anxiety disorder: Secondary | ICD-10-CM | POA: Diagnosis not present

## 2023-11-12 DIAGNOSIS — F01C0 Vascular dementia, severe, without behavioral disturbance, psychotic disturbance, mood disturbance, and anxiety: Secondary | ICD-10-CM

## 2023-11-12 DIAGNOSIS — I10 Essential (primary) hypertension: Secondary | ICD-10-CM

## 2023-11-12 DIAGNOSIS — E785 Hyperlipidemia, unspecified: Secondary | ICD-10-CM

## 2023-11-12 MED ORDER — ATORVASTATIN CALCIUM 20 MG PO TABS: 20.0000 mg | ORAL_TABLET | Freq: Every day | ORAL | 2 refills | Status: AC

## 2023-11-12 MED ORDER — AMLODIPINE BESYLATE 5 MG PO TABS: 5.0000 mg | ORAL_TABLET | Freq: Every day | ORAL | 2 refills | Status: AC

## 2023-11-12 MED ORDER — LOSARTAN POTASSIUM 25 MG PO TABS: 25.0000 mg | ORAL_TABLET | Freq: Two times a day (BID) | ORAL | 2 refills | Status: AC

## 2023-11-12 MED ORDER — BISOPROLOL FUMARATE 10 MG PO TABS: 10.0000 mg | ORAL_TABLET | Freq: Every day | ORAL | 2 refills | Status: AC

## 2023-11-12 NOTE — Assessment & Plan Note (Addendum)
 She is no longer taking Aricept or Namenda Her mood is normal and she denies concerns However, she scores very low on 6-CIT with continued decline since 2020 She lives with her son who provides her medications and support Agree with remaining off of medications

## 2023-11-12 NOTE — Assessment & Plan Note (Signed)
 Previously on Valium but this is no longer needed Will discontinue.

## 2023-11-12 NOTE — Patient Instructions (Addendum)
 [facility] [address] [place], [place] [address] Phone: [phone]

## 2023-11-12 NOTE — Progress Notes (Signed)
 Date:  11/12/2023   Name:  Casey Lara   DOB:  October 01, 1950   MRN:  [id number]   Chief Complaint: Hypertension  Hypertension This is a chronic problem. The problem is controlled. Pertinent negatives include no chest pain, headaches, palpitations or shortness of breath. Past treatments include beta blockers, angiotensin blockers and calcium channel blockers. The current treatment provides significant improvement.  Hyperlipidemia This is a chronic problem. The problem is controlled. Pertinent negatives include no chest pain, myalgias or shortness of breath. Current antihyperlipidemic treatment includes statins.  Dementia - she is doing well per son Casey Lara other than than memory loss.  No agitation or anxiety.  He manages her medications, meals, transportation, etc.  She uses depends due to incontinence.  She is minimally active.  She stopped Aricept months ago but has continued to take Namenda intermittently.  Her mental status testing shows severe deficits progressive since 2020.  Review of Systems  Constitutional:  Negative for chills, fatigue and unexpected weight change.  HENT:  Negative for trouble swallowing.   Eyes:  Negative for visual disturbance.  Respiratory:  Negative for cough, chest tightness, shortness of breath and wheezing.   Cardiovascular:  Negative for chest pain, palpitations and leg swelling.  Gastrointestinal:  Negative for abdominal pain, constipation and diarrhea.  Musculoskeletal:  Negative for arthralgias and myalgias.  Neurological:  Negative for dizziness, weakness, light-headedness and headaches.     Lab Results  Component Value Date   NA 146 (H) 11/12/2023   K 3.8 11/12/2023   CO2 21 11/12/2023   GLUCOSE 101 (H) 11/12/2023   BUN 10 11/12/2023   CREATININE 0.96 11/12/2023   CALCIUM 9.4 11/12/2023   EGFR 63 11/12/2023   GFRNONAA >60 11/05/2020   Lab Results  Component Value Date   CHOL 140 11/12/2023   HDL 24 (L) 11/12/2023   LDLCALC 65  11/12/2023   TRIG 323 (H) 11/12/2023   CHOLHDL 5.8 (H) 11/12/2023   Lab Results  Component Value Date   TSH 2.220 11/12/2023   Lab Results  Component Value Date   HGBA1C 6.3 (H) 09/23/2022   Lab Results  Component Value Date   WBC 6.9 11/12/2023   HGB 14.7 11/12/2023   HCT 43.3 11/12/2023   MCV 90 11/12/2023   PLT 213 11/12/2023   Lab Results  Component Value Date   ALT 49 (H) 11/12/2023   AST 58 (H) 11/12/2023   ALKPHOS 74 11/12/2023   BILITOT 0.7 11/12/2023   No results found for: "25OHVITD2", "25OHVITD3", "VD25OH"   Patient Active Problem List   Diagnosis Date Noted   Pulmonary nodules 11/03/2020   Aortic atherosclerosis (HCC) 10/01/2020   Dementia (HCC) 12/23/2018   Tobacco use disorder, severe, in early remission 06/19/2016   Adenomatous colon polyp 06/19/2015   Constipation 06/19/2015   Bilateral low back pain with left-sided sciatica 06/19/2015   H/O varicella 04/12/2015   Obstructive apnea 02/11/2015   History of malignant melanoma of skin 02/11/2015   Dyslipidemia 02/11/2015   Allergy 02/11/2015   Essential (primary) hypertension 02/11/2015   Generalized anxiety disorder 02/11/2015   Insomnia, persistent 02/11/2015   CMC arthritis, thumb, degenerative 02/11/2015    Allergies  Allergen Reactions   Mirtazapine Other (See Comments)    Hyponatremia   Hctz [Hydrochlorothiazide] Other (See Comments)    hyponatremia    Past Surgical History:  Procedure Laterality Date   CARPAL TUNNEL RELEASE Left 2014   COLONOSCOPY  05/2013   serrated adenoma   LUMBAR DISC  SURGERY     MELANOMA EXCISION  2015    Social History   Tobacco Use   Smoking status: Former    Current packs/day: 0.00    Average packs/day: 1 pack/day for 54.2 years (54.2 ttl pk-yrs)    Types: Cigarettes    Start date: 08/03/1966    Quit date: 11/01/2020    Years since quitting: 3.0   Smokeless tobacco: Never   Tobacco comments:    smoking since age 83  1 pack daily   Vaping Use    Vaping status: Never Used  Substance Use Topics   Alcohol use: No    Alcohol/week: 0.0 standard drinks of alcohol   Drug use: No     Medication list has been reviewed and updated.  Current Meds  Medication Sig   albuterol (PROVENTIL HFA;VENTOLIN HFA) 108 (90 Base) MCG/ACT inhaler TAKE 2 PUFFS 4 TIMES A DAY (Patient taking differently: as needed. TAKE 2 PUFFS 4 TIMES A DAY)   fluticasone (FLONASE) 50 MCG/ACT nasal spray SPRAY 2 SPRAYS INTO EACH NOSTRIL EVERY DAY   Multiple Vitamin (MULTIVITAMIN WITH MINERALS) TABS tablet Take 1 tablet by mouth daily.   [DISCONTINUED] amLODipine (NORVASC) 5 MG tablet TAKE 1 TABLET (5 MG TOTAL) BY MOUTH DAILY.   [DISCONTINUED] atorvastatin (LIPITOR) 20 MG tablet TAKE 1 TABLET BY MOUTH DAILY AT 6 PM.   [DISCONTINUED] bisoprolol (ZEBETA) 10 MG tablet TAKE 1 TABLET BY MOUTH EVERY DAY   [DISCONTINUED] diazepam (VALIUM) 5 MG tablet TAKE 1 TABLET BY MOUTH EVERY DAY AS NEEDED   [DISCONTINUED] donepezil (ARICEPT) 10 MG tablet TAKE 1 TABLET BY MOUTH EVERYDAY AT BEDTIME   [DISCONTINUED] losartan (COZAAR) 25 MG tablet Take 1 tablet (25 mg total) by mouth in the morning and at bedtime.       11/12/2023    4:05 PM 09/23/2022    4:04 PM 09/30/2021    4:07 PM 03/17/2021    1:38 PM  GAD 7 : Generalized Anxiety Score  Nervous, Anxious, on Edge 0 0 0 0  Control/stop worrying 0 0 0 0  Worry too much - different things 0 0 0 0  Trouble relaxing 0 0 0 0  Restless 0 0 0 0  Easily annoyed or irritable 0 0 0 0  Afraid - awful might happen 0 0 0 0  Total GAD 7 Score 0 0 0 0  Anxiety Difficulty Not difficult at all Not difficult at all Not difficult at all        11/12/2023    4:05 PM 09/23/2022    4:04 PM 09/30/2021    4:07 PM  Depression screen PHQ 2/9  Decreased Interest 0 2 2  Down, Depressed, Hopeless 0 0 2  PHQ - 2 Score 0 2 4  Altered sleeping 0 2 0  Tired, decreased energy 1 0 2  Change in appetite 0 0 0  Feeling bad or failure about yourself  0 0 0   Trouble concentrating 0 0 2  Moving slowly or fidgety/restless 0 0 0  Suicidal thoughts 0 0 0  PHQ-9 Score 1 4 8   Difficult doing work/chores Not difficult at all Not difficult at all Not difficult at all    BP Readings from Last 3 Encounters:  11/12/23 120/78  09/23/22 (!) 102/52  09/30/21 116/82    Physical Exam Constitutional:      Appearance: Normal appearance.  Neck:     Vascular: No carotid bruit.  Cardiovascular:     Rate and Rhythm: Normal  rate and regular rhythm.     Heart sounds: No murmur heard. Pulmonary:     Effort: Pulmonary effort is normal. No respiratory distress.     Breath sounds: No wheezing or rhonchi.  Abdominal:     Palpations: Abdomen is soft.     Tenderness: There is no abdominal tenderness.  Musculoskeletal:     Cervical back: Normal range of motion.     Right lower leg: No edema.     Left lower leg: No edema.  Lymphadenopathy:     Cervical: No cervical adenopathy.  Skin:    General: Skin is warm.  Neurological:     Mental Status: She is alert.     Cranial Nerves: Cranial nerves 2-12 are intact.     Motor: Motor function is intact.  Psychiatric:        Attention and Perception: Attention normal.        Mood and Affect: Mood normal.        Speech: Speech normal.        Behavior: Behavior normal. Behavior is cooperative.        Cognition and Memory: She exhibits impaired recent memory and impaired remote memory.       11/12/2023    3:58 PM 05/13/2020    1:23 PM 01/12/2020    1:32 PM 09/28/2019   10:33 AM 08/22/2018    9:49 AM  6CIT Screen  What Year? 4 points 4 points  4 points  4 points 4 points  What month? 3 points 3 points  0 points 3 points 0 points  What time? 3 points 0 points 3 points 0 points 0 points  Count back from 20 4 points 0 points 4 points  4 points 0 points  Months in reverse 4 points 4 points  4 points  4 points 2 points  Repeat phrase 4 points 6 points 2 points  6 points 4 points  Total Score 22 points 17 points 17  points 21 points 10 points     Significant value    Wt Readings from Last 3 Encounters:  11/12/23 159 lb 4 oz (72.2 kg)  09/23/22 180 lb (81.6 kg)  09/30/21 161 lb (73 kg)    BP 120/78   Pulse 83   Ht 5\' 3"  (1.6 m)   Wt 159 lb 4 oz (72.2 kg)   SpO2 98%   BMI 28.21 kg/m   Assessment and Plan:  Problem List Items Addressed This Visit       Unprioritized   Dyslipidemia (Chronic)   LDL is  Lab Results  Component Value Date   LDLCALC 65 11/12/2023   Current regimen is atorvastatin.  No medication side effects noted. Goal LDL is <70.        Relevant Medications   atorvastatin (LIPITOR) 20 MG tablet   Other Relevant Orders   Lipid panel (Completed)   Essential (primary) hypertension - Primary (Chronic)   Blood pressure is well controlled.  Current medications are losartan, amlodipine and bisoprolol. Will continue same regimen along with efforts to limit dietary sodium.       Relevant Medications   amLODipine (NORVASC) 5 MG tablet   bisoprolol (ZEBETA) 10 MG tablet   losartan (COZAAR) 25 MG tablet   atorvastatin (LIPITOR) 20 MG tablet   Other Relevant Orders   CBC with Differential/Platelet (Completed)   Comprehensive metabolic panel with GFR (Completed)   TSH (Completed)   Generalized anxiety disorder (Chronic)   Previously on Valium but this  is no longer needed Will discontinue.      Dementia (HCC)   She is no longer taking Aricept or Namenda Her mood is normal and she denies concerns However, she scores very low on 6-CIT with continued decline since 2020 She lives with her son who provides her medications and support Agree with remaining off of medications       Discussion son regarding distance to our clinic along with his transportation issues.  Recommend [facility] @ [place] which is only 8 miles from his home.  He will consider.  I let him know that I am retiring and she will need to establish with a new provider who will want to see her  bi-annually. Medications refilled for one year.  No follow-ups on file.    Sheron Dixons, MD [facility] [place]

## 2023-11-13 LAB — LIPID PANEL
Chol/HDL Ratio: 5.8 ratio — ABNORMAL HIGH (ref 0.0–4.4)
Cholesterol, Total: 140 mg/dL (ref 100–199)
HDL: 24 mg/dL — ABNORMAL LOW (ref >39)
LDL Chol Calc (NIH): 65 mg/dL (ref 0–99)
Triglycerides: 323 mg/dL — ABNORMAL HIGH (ref 0–149)
VLDL Cholesterol Cal: 51 mg/dL — ABNORMAL HIGH (ref 5–40)

## 2023-11-13 LAB — CBC WITH DIFFERENTIAL/PLATELET
Basophils Absolute: 0.1 10*3/uL (ref 0.0–0.2)
Basos: 1 %
EOS (ABSOLUTE): 0.2 10*3/uL (ref 0.0–0.4)
Eos: 2 %
Hematocrit: 43.3 % (ref 34.0–46.6)
Hemoglobin: 14.7 g/dL (ref 11.1–15.9)
Immature Grans (Abs): 0 10*3/uL (ref 0.0–0.1)
Immature Granulocytes: 0 %
Lymphocytes Absolute: 2.4 10*3/uL (ref 0.7–3.1)
Lymphs: 35 %
MCH: 30.6 pg (ref 26.6–33.0)
MCHC: 33.9 g/dL (ref 31.5–35.7)
MCV: 90 fL (ref 79–97)
Monocytes Absolute: 0.5 10*3/uL (ref 0.1–0.9)
Monocytes: 7 %
Neutrophils Absolute: 3.8 10*3/uL (ref 1.4–7.0)
Neutrophils: 55 %
Platelets: 213 10*3/uL (ref 150–450)
RBC: 4.8 x10E6/uL (ref 3.77–5.28)
RDW: 12.3 % (ref 11.7–15.4)
WBC: 6.9 10*3/uL (ref 3.4–10.8)

## 2023-11-13 LAB — COMPREHENSIVE METABOLIC PANEL WITH GFR
ALT: 49 IU/L — ABNORMAL HIGH (ref 0–32)
AST: 58 IU/L — ABNORMAL HIGH (ref 0–40)
Albumin: 4.1 g/dL (ref 3.8–4.8)
Alkaline Phosphatase: 74 IU/L (ref 44–121)
BUN/Creatinine Ratio: 10 — ABNORMAL LOW (ref 12–28)
BUN: 10 mg/dL (ref 8–27)
Bilirubin Total: 0.7 mg/dL (ref 0.0–1.2)
CO2: 21 mmol/L (ref 20–29)
Calcium: 9.4 mg/dL (ref 8.7–10.3)
Chloride: 107 mmol/L — ABNORMAL HIGH (ref 96–106)
Creatinine, Ser: 0.96 mg/dL (ref 0.57–1.00)
Globulin, Total: 2.8 g/dL (ref 1.5–4.5)
Glucose: 101 mg/dL — ABNORMAL HIGH (ref 70–99)
Potassium: 3.8 mmol/L (ref 3.5–5.2)
Sodium: 146 mmol/L — ABNORMAL HIGH (ref 134–144)
Total Protein: 6.9 g/dL (ref 6.0–8.5)
eGFR: 63 mL/min/{1.73_m2} (ref >59)

## 2023-11-13 LAB — TSH: TSH: 2.22 u[IU]/mL (ref 0.450–4.500)

## 2023-11-14 ENCOUNTER — Encounter: Payer: Self-pay | Admitting: Internal Medicine

## 2023-11-14 NOTE — Assessment & Plan Note (Signed)
 Blood pressure is well controlled.  Current medications are losartan, amlodipine and bisoprolol. Will continue same regimen along with efforts to limit dietary sodium.

## 2023-11-14 NOTE — Assessment & Plan Note (Signed)
 LDL is  Lab Results  Component Value Date   LDLCALC 65 11/12/2023   Current regimen is atorvastatin.  No medication side effects noted. Goal LDL is <70.

## 2024-04-21 ENCOUNTER — Telehealth: Payer: Self-pay | Admitting: Internal Medicine

## 2024-04-21 NOTE — Telephone Encounter (Signed)
 Copied from CRM [id number]. Topic: Medicare AWV >> Apr 21, 2024 10:46 AM Nathanel DEL wrote: Reason for CRM: Called LVM 04/21/2024 to schedule AWV. Please schedule Virtual or Telehealth visits ONLY.   Nathanel Paschal; Care Guide Ambulatory Clinical Support [place] [facility] Direct Dial: [phone]
